# Patient Record
Sex: Female | Born: 1985 | Race: Black or African American | Hispanic: No | Marital: Married | Smoking: Never smoker
Health system: Southern US, Community
[De-identification: ages and names within clinical notes are randomized; demographics above are authoritative.]

## PROBLEM LIST (undated history)

## (undated) ENCOUNTER — Inpatient Hospital Stay (HOSPITAL_COMMUNITY): Payer: Self-pay

## (undated) DIAGNOSIS — G4730 Sleep apnea, unspecified: Secondary | ICD-10-CM

## (undated) DIAGNOSIS — O24419 Gestational diabetes mellitus in pregnancy, unspecified control: Secondary | ICD-10-CM

## (undated) DIAGNOSIS — Z9889 Other specified postprocedural states: Secondary | ICD-10-CM

## (undated) DIAGNOSIS — D649 Anemia, unspecified: Secondary | ICD-10-CM

## (undated) DIAGNOSIS — D219 Benign neoplasm of connective and other soft tissue, unspecified: Secondary | ICD-10-CM

## (undated) DIAGNOSIS — IMO0002 Reserved for concepts with insufficient information to code with codable children: Secondary | ICD-10-CM

## (undated) DIAGNOSIS — O139 Gestational [pregnancy-induced] hypertension without significant proteinuria, unspecified trimester: Secondary | ICD-10-CM

## (undated) DIAGNOSIS — O09299 Supervision of pregnancy with other poor reproductive or obstetric history, unspecified trimester: Secondary | ICD-10-CM

## (undated) DIAGNOSIS — R87629 Unspecified abnormal cytological findings in specimens from vagina: Secondary | ICD-10-CM

## (undated) HISTORY — DX: Unspecified abnormal cytological findings in specimens from vagina: R87.629

## (undated) HISTORY — PX: OTHER SURGICAL HISTORY: SHX169

---
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
## 2008-11-15 DIAGNOSIS — Z9889 Other specified postprocedural states: Secondary | ICD-10-CM

## 2008-11-15 HISTORY — DX: Other specified postprocedural states: Z98.890

## 2009-11-15 DIAGNOSIS — IMO0002 Reserved for concepts with insufficient information to code with codable children: Secondary | ICD-10-CM

## 2009-11-15 DIAGNOSIS — R87619 Unspecified abnormal cytological findings in specimens from cervix uteri: Secondary | ICD-10-CM

## 2009-11-15 HISTORY — DX: Unspecified abnormal cytological findings in specimens from cervix uteri: R87.619

## 2009-11-15 HISTORY — DX: Reserved for concepts with insufficient information to code with codable children: IMO0002

## 2012-10-09 LAB — OB RESULTS CONSOLE HEPATITIS B SURFACE ANTIGEN: Hepatitis B Surface Ag: NEGATIVE

## 2012-10-09 LAB — OB RESULTS CONSOLE HIV ANTIBODY (ROUTINE TESTING): HIV: NONREACTIVE

## 2012-10-09 LAB — OB RESULTS CONSOLE RUBELLA ANTIBODY, IGM: Rubella: IMMUNE

## 2012-10-30 ENCOUNTER — Other Ambulatory Visit (HOSPITAL_COMMUNITY)
Admission: RE | Admit: 2012-10-30 | Discharge: 2012-10-30 | Disposition: A | Discharge status: Home / Self Care | Payer: BC Managed Care – PPO | Source: Ambulatory Visit | Attending: Obstetrics and Gynecology | Admitting: Obstetrics and Gynecology

## 2012-10-30 DIAGNOSIS — Z113 Encounter for screening for infections with a predominantly sexual mode of transmission: Secondary | ICD-10-CM | POA: Insufficient documentation

## 2012-10-30 DIAGNOSIS — Z01419 Encounter for gynecological examination (general) (routine) without abnormal findings: Secondary | ICD-10-CM | POA: Insufficient documentation

## 2012-11-10 ENCOUNTER — Other Ambulatory Visit: Payer: Self-pay

## 2012-11-15 ENCOUNTER — Inpatient Hospital Stay (HOSPITAL_COMMUNITY)
Admission: AD | Admit: 2012-11-15 | Discharge: 2012-11-15 | Disposition: A | Discharge status: Home / Self Care | Payer: BC Managed Care – PPO | Source: Ambulatory Visit | Attending: Obstetrics and Gynecology | Admitting: Obstetrics and Gynecology

## 2012-11-15 ENCOUNTER — Encounter (HOSPITAL_COMMUNITY): Payer: Self-pay

## 2012-11-15 ENCOUNTER — Inpatient Hospital Stay (HOSPITAL_COMMUNITY): Payer: BC Managed Care – PPO

## 2012-11-15 DIAGNOSIS — O26859 Spotting complicating pregnancy, unspecified trimester: Secondary | ICD-10-CM | POA: Insufficient documentation

## 2012-11-15 DIAGNOSIS — O4690 Antepartum hemorrhage, unspecified, unspecified trimester: Secondary | ICD-10-CM

## 2012-11-15 NOTE — MAU Provider Note (Signed)
  History     CSN: [id number]  Arrival date and time: 11/15/12 1103   None     Chief Complaint  Patient presents with  . Vaginal Bleeding   HPI  Pt is G1P0 with spotting in pregnancy - she initially had spotting in pregnancy PC on 12/15 and the spotting stopped and then had another ultrasound done on 12/19 with confirmation of IUP.  This morning had some spotting notices mostly when goes to the bathroom.  Pt denies cramping or pain or UTI symptoms. Pt says she was treated for an STI on 12/19 and will have f/u on return OB visit.  No past medical history on file.  Past Surgical History  Procedure Date  . Cyst on foot     No family history on file.  History  Substance Use Topics  . Smoking status: Never Smoker   . Smokeless tobacco: Not on file  . Alcohol Use: No    Allergies:  Allergies  Allergen Reactions  . Penicillins Other (See Comments)    Sleeps a lot more than usual  . Shellfish Allergy Hives    Prescriptions prior to admission  Medication Sig Dispense Refill  . Fe Fum-FePoly-Vit C-Vit B3 (INTEGRA PO) Take 1 capsule by mouth daily.      Marland Kitchen guaiFENesin (MUCINEX) 600 MG 12 hr tablet Take 600 mg by mouth every 12 (twelve) hours.      . ondansetron (ZOFRAN) 4 MG tablet Take 4 mg by mouth every 8 (eight) hours as needed. For nausea      . Prenatal Vit-Fe Fumarate-FA (PRENATAL MULTIVITAMIN) TABS Take 1 tablet by mouth daily.        Review of Systems  Constitutional: Negative for fever and chills.  Gastrointestinal: Negative for nausea, vomiting, abdominal pain, diarrhea and constipation.  Genitourinary: Negative for dysuria and urgency.  Neurological: Negative for headaches.   Physical Exam   Blood pressure 133/78, pulse 84, temperature 98.4 F (36.9 C), temperature source Oral, resp. rate 16, height 5\' 5"  (1.651 m), weight 291 lb 6 oz (132.167 kg), last menstrual period 08/16/2012.  Physical Exam  Nursing note and vitals reviewed. Constitutional: She is  oriented to person, place, and time. She appears well-developed and well-nourished.  HENT:  Head: Normocephalic.  Eyes: Pupils are equal, round, and reactive to light.  Neck: Normal range of motion. Neck supple.  Cardiovascular: Normal rate.   Respiratory: Effort normal.  GI: Soft. Bowel sounds are normal.  Genitourinary:       Small amount of bright blood in vault; cervix closed; uterus unable to palpable - FHT unable to doppler due to habitus  Musculoskeletal: Normal range of motion.  Neurological: She is alert and oriented to person, place, and time.  Skin: Skin is warm and dry.  Psychiatric: She has a normal mood and affect.  unable to doppler FHT, so ultrasound performed  MAU Course  Procedures   No results found for this or any previous visit (from the past 24 hour(s)). Ultrasound showed single living IUP [redacted] weeks GA; no evidence of SCH - placenta above cervical os Attempted to call Dr. Idamae Schuller- LM on VM Pt discharged home- to return if increase in bleeding or pain Assessment and Plan  Bleeding in pregnancy- f/u with Dr. Idamae Schuller- return sooner if increase in bleeding LINEBERRY,SUSAN 11/15/2012, 12:19 PM

## 2012-11-15 NOTE — MAU Note (Signed)
Unable to obtain fht by doppler

## 2012-11-15 NOTE — MAU Note (Signed)
Pt states began bleeding at 0930 this am, passed 2 small clots, ?eraserhead sized, denies pain at present. Had u/s 11/02/2012, routine.

## 2013-01-04 ENCOUNTER — Other Ambulatory Visit: Payer: Self-pay | Admitting: Obstetrics and Gynecology

## 2013-01-04 DIAGNOSIS — IMO0002 Reserved for concepts with insufficient information to code with codable children: Secondary | ICD-10-CM

## 2013-01-11 ENCOUNTER — Ambulatory Visit (HOSPITAL_COMMUNITY)
Admission: RE | Admit: 2013-01-11 | Discharge: 2013-01-11 | Disposition: A | Discharge status: Home / Self Care | Payer: BC Managed Care – PPO | Source: Ambulatory Visit | Attending: Obstetrics and Gynecology | Admitting: Obstetrics and Gynecology

## 2013-01-11 DIAGNOSIS — O99210 Obesity complicating pregnancy, unspecified trimester: Secondary | ICD-10-CM | POA: Insufficient documentation

## 2013-01-11 DIAGNOSIS — E669 Obesity, unspecified: Secondary | ICD-10-CM | POA: Insufficient documentation

## 2013-01-11 DIAGNOSIS — IMO0002 Reserved for concepts with insufficient information to code with codable children: Secondary | ICD-10-CM

## 2013-01-11 DIAGNOSIS — Z363 Encounter for antenatal screening for malformations: Secondary | ICD-10-CM | POA: Insufficient documentation

## 2013-01-11 DIAGNOSIS — Z1389 Encounter for screening for other disorder: Secondary | ICD-10-CM | POA: Insufficient documentation

## 2013-01-11 DIAGNOSIS — O358XX0 Maternal care for other (suspected) fetal abnormality and damage, not applicable or unspecified: Secondary | ICD-10-CM | POA: Insufficient documentation

## 2013-01-11 DIAGNOSIS — O99810 Abnormal glucose complicating pregnancy: Secondary | ICD-10-CM | POA: Insufficient documentation

## 2013-02-06 ENCOUNTER — Other Ambulatory Visit: Payer: Self-pay | Admitting: Obstetrics and Gynecology

## 2013-02-06 DIAGNOSIS — O24419 Gestational diabetes mellitus in pregnancy, unspecified control: Secondary | ICD-10-CM

## 2013-02-08 ENCOUNTER — Ambulatory Visit (HOSPITAL_COMMUNITY)
Admission: RE | Admit: 2013-02-08 | Discharge: 2013-02-08 | Disposition: A | Discharge status: Home / Self Care | Payer: BC Managed Care – PPO | Source: Ambulatory Visit

## 2013-02-08 ENCOUNTER — Other Ambulatory Visit: Payer: Self-pay

## 2013-02-08 ENCOUNTER — Ambulatory Visit (HOSPITAL_COMMUNITY)
Admission: RE | Admit: 2013-02-08 | Discharge: 2013-02-08 | Disposition: A | Discharge status: Home / Self Care | Payer: BC Managed Care – PPO | Source: Ambulatory Visit | Attending: Obstetrics and Gynecology | Admitting: Obstetrics and Gynecology

## 2013-02-08 VITALS — BP 140/85 | HR 82 | Wt 296.5 lb

## 2013-02-08 DIAGNOSIS — O99810 Abnormal glucose complicating pregnancy: Secondary | ICD-10-CM | POA: Insufficient documentation

## 2013-02-08 DIAGNOSIS — O358XX0 Maternal care for other (suspected) fetal abnormality and damage, not applicable or unspecified: Secondary | ICD-10-CM | POA: Insufficient documentation

## 2013-02-08 DIAGNOSIS — O99210 Obesity complicating pregnancy, unspecified trimester: Secondary | ICD-10-CM | POA: Insufficient documentation

## 2013-02-08 DIAGNOSIS — O24419 Gestational diabetes mellitus in pregnancy, unspecified control: Secondary | ICD-10-CM

## 2013-02-08 DIAGNOSIS — E669 Obesity, unspecified: Secondary | ICD-10-CM | POA: Insufficient documentation

## 2013-02-08 NOTE — Progress Notes (Signed)
[name]  was seen today for an ultrasound appointment.  See full report in [facility].  Comments: Ms. Adah Salvage returns today for follow up.  On previous ultrasound, inferior hypoplasia of the cerebellar vermis was noted.  She previously declined amniocentesis and NIPT.  On ultrasound today, this finding is again confirmed.  The remainder of the cranial anatomy appears normal.  A large AV canal is appreciated.  Somewhat limited views of the outflow tracts were obtained due to fetal position and maternal body habitus.  The findings and limitations of the study were discussed with the patient.  The cerebellar abnormalities in addition to the AV canal defect are suspicious for aneuploidy (specifically Trisomy 21) or a genetic syndrome.  See separate note from [employer], broadcasting/film/video.  Impression: Single IUP at 23 5/7 weeks Inferior hypoplasia of the cerebellar vermis - remainder of the cranial anatomy appears normal Large AV canal defect The estimated fetal weight today is at the 22nd %tile; however, the [facility] measures < 3rd%tile (asymmetric growth) Normal amniotic fluid volume  Recommendations: Fetal echo with Peds cardiology (scheduled) Follow up ultrasound for interval growth/ anatomy in 3 weeks Consider fetal MRI in the 3rd trimester to better evaluate the fetal cranium  Alpha Gula, MD

## 2013-02-09 ENCOUNTER — Other Ambulatory Visit: Payer: Self-pay

## 2013-02-09 NOTE — Progress Notes (Addendum)
Genetic Counseling  High-Risk Gestation Note  Appointment Date:  02/08/2013 Referred By: Geryl Rankins, MD Date of Birth:  02-16-1986    Pregnancy History: G1P0 Estimated Date of Delivery: 05/23/13 Estimated Gestational Age: [redacted]w[redacted]d Attending: Alpha Gula, MD   I met with Ms. Robyne Lassiter and her partner, Mr. Emaly Boschert, for genetic counseling because of abnormal ultrasound findings.  We began by reviewing the ultrasound in detail. The patient was previously seen for ultrasound on 01/11/13. Ultrasound at that time visualized hypoplasia of inferior cerebellar vermis, and fetal heart views were not visualized at that time. Follow-up ultrasound today visualized atrioventricular septal defect (AV canal defect) as well as hypoplasia of the inferior cerebellar vermis. The abdominal circumference measures < 3rd%tile (asymmetric growth). A follow-up ultrasound was scheduled in 3 weeks to assess fetal growth and anatomy.   Congenital heart defects occur in approximately 1% of pregnancies. Congenital heart defects may occur due to multifactorial influences, chromosomal abnormalities, genetic syndromes or environmental exposures.  Atrioventricular septal defect (also referred to as AV canal defect or endocardial cushion defect) describes a central defect of the heart involving the atrial septum, ventricular septum, and atrioventricular valves. Fetal echocardiogram was offered for further evaluation of fetal heart, which the couple accepted. Fetal echocardiogram appointment was planned for 02/09/13 with Dr. Bobbye Morton at [facility] office in [place].   The inferior vermis is part of the cerebellum in the brain. Timing of the formation of the cerebellar vermis in gestation is variable, and the vermis may be incomplete in some cases prior to 18 weeks. When hypoplasia or agenesis of the inferior vermis is present in a pregnancy, outcome has been observed to be extremely  variable. Hypoplasia/agenesis of the inferior vermis typically occurs sporadically, but may be associated with chromosome conditions or other genetic syndromes, such as Joubert syndrome. When hypoplasia of the inferior vermis is present, an increased risk for associated congenital anomalies has also been reported. Long-term prognosis depends on the underlying cause and presence of additional features. Isolated hypoplasia of the inferior vermis has been associated with overall good prognosis but has also been associated with an increased risk for developmental delays.  We discussed the variable outcomes associated with hypoplasia of the inferior vermis and that in the absence of a determined underlying etiology, long-term prognosis cannot be determined.   We discussed that an AV canal defect is associated with an increased risk for fetal aneuploidy of approximately 50-60%. Specifically, we discussed that AV canal defect is associated with a significant increased risk for Trisomy 21, which is present in approximately 40% of cases of AV canal defect. In general, the presence of multiple ultrasound findings increases the likelihood for a single underlying etiology. We reviewed chromosomes, genes, nondisjunction and examples and prognoses of various chromosome conditions including Down syndrome (trisomy 66), trisomy 31, and trisomy 42.  Additionally, congenital heart defects and cerebellar vermis hypoplasia can be seen as part of an underlying microdeletion/microduplication syndrome or single gene condition, such as 3C syndrome (Ritscher-Schinzel syndrome).  Congenital heart defects can also be due to teratogenic exposures in pregnancy. Ms. Veleta Yamamoto denied exposure to environmental toxins or chemical agents. She denied the use of alcohol, tobacco or street drugs. She denied significant viral illnesses during the course of her pregnancy. We reviewed that the ultrasound findings may also be isolated and unrelated  to each other and due to multifactorial etiology. We reviewed that the overall prognosis is dependent on the severity of the heart defect and  the underlying etiology.  Risk for recurrence depends on etiology. We also discussed the fact that we may not know the exact etiology until after the baby is born or maybe not until the baby grows and develops.   We reviewed available screening and diagnostic options.  Regarding screening tests, we discussed the options of noninvasive prenatal testing (NIPT). Specifically, we discussed that NIPT analyzes cell free fetal DNA found in the maternal circulation. This test is not diagnostic for chromosome conditions, but can provide information regarding the presence or absence of extra fetal DNA for chromosomes 13, 18 and 21, as well as extra or missing material from chromosomes X and Y. Thus, it would not identify or rule out all fetal aneuploidy and does not screen for all genetic conditions. The reported detection rate is greater than 99% for Trisomy 21, greater than 97% for Trisomy 18, and is approximately 80% (8 out of 10) for Trisomy 13. The false positive rate is reported to be less than 0.1% for any of these conditions.   This couple was also counseled regarding the option of chromosome analysis via amniocentesis. We reviewed the approximate 1 in 300-500 risk for complications, including spontaneous pregnancy loss. We reviewed that testing for single gene conditions is typically not performed via amniocentesis, unless ultrasound findings or family history are strongly suggestive of a particular syndrome. After consideration of all the options, they elected to proceed with cell free fetal DNA testing and declined amniocentesis. They understand that screening cannot diagnose or rule out all birth defects or genetic conditions. The couple stated that they are not interested in amniocentesis given the associated risk of complications. Fetal echocardiogram was planned for  02/09/13 and follow-up ultrasound scheduled for 03/02/13. Additionally, fetal MRI is available later in pregnancy to further view cranial anatomy.   Both family histories were reviewed and found to be contributory for cleft lip for the father of the pregnancy's paternal first cousin. This relative reportedly had surgical correction after birth and is otherwise healthy.  We discussed that cleft lip +/- cleft palate can be syndromic or isolated.  If the patient's relative has a syndromic form of clefting, the chance of having an affected child depends on the inheritance pattern of that condition.  If the patient's relative has an isolated form of clefting, we discussed the probable multifactorial inheritance and explained that genetic testing for isolated cleft lip +/- cleft palate is not currently available.  Based on the family history, this couple's chance to have a baby with an isolated cleft lip +/- cleft palate is not expected to be increased above the general population risk.   Additionally, Mr. Boer reported that his paternal first cousin (the brother to the previously discussed cousin) has sickle cell anemia. The father of the pregnancy's brother reportedly has sickle cell trait. The father of the pregnancy and the patient both reported that they do not have sickle cell trait. Medical documentation confirming this report was not available at the time of today's visit. We discussed sickle cell anemia (SCA) including: the features of SCA, autosomal recessive inheritance, the approximate 1 in 34 carrier frequency in the [place] American population, and the availability of carrier testing.  We also discussed that testing for SCA can be done prenatally via amniocentesis, if desired, when both parents are carriers of sickle cell trait, and it is also routinely screened for on the newborn screening panel.  We discussed that if both Ms. Lassiter and her partner do not have sickle cell  trait, then the pregnancy  would not be at increased risk for sickle cell disease. If they have not been screened, however, then the risk for sickle cell disease in the pregnancy would be approximately 1 in 61 (assuming general population carrier frequency for Ms. Lassiter and a 1 in 2 chance for Mr. Crosby to have sickle cell trait, given the reported family history). If not previously performed, screening via hemoglobin electrophoresis is available to the patient. Without further information regarding the provided family history, an accurate genetic risk cannot be calculated. Further genetic counseling is warranted if more information is obtained.   I counseled this couple regarding the above risks and available options.  The approximate face-to-face time with the genetic counselor was 30 minutes.  Quinn Plowman, MS Certified Genetic Counselor 02/09/2013

## 2013-02-19 ENCOUNTER — Telehealth (HOSPITAL_COMMUNITY): Payer: Self-pay | Admitting: MS"

## 2013-02-19 NOTE — Telephone Encounter (Signed)
Called Fusaye Lassiter to discuss her Harmony, cell free fetal DNA testing. Patient was identified by name and DOB. Testing was offered because of ultrasound finding of AV canal defect. We reviewed that these are positive for Down syndrome, indicating a >99% chance for Down syndrome in the current pregnancy. We reviewed that this testing identified >99% of pregnancies with Down syndrome and has a false positive rate of <0.1%. Reviewed that follow-up testing is recommended to confirm this result and that this would be available either through amniocentesis in pregnancy or postnatal chromosome analysis. Ms. Adah Salvage indicated that she would likely prefer postnatal chromosome analysis.   Ms. Adah Salvage asked if this testing indicated the severity of Down syndrome in the pregnancy. We reviewed that cell free fetal DNA, as well as karyotype analysis via amniocentesis or postnatally cannot correlate severity or specific features of Down syndrome to the specific karyotype, given that all individuals with Down syndrome have a third copy of chromosome 21 but varying features.   Offered follow-up genetic counseling to the couple to further discuss these results as well as discuss available resources. Ms. Adah Salvage is interested in additional resources. She has follow-up ultrasound planned in our office on 03/02/13 at 8:00. I offered to follow-up with the patient on that date, or earlier, if she preferred. She planned for follow-up genetic counseling at 9:00 following her ultrasound on 03/02/13.   Additionally, this testing was within normal limits for trisomies 18 and 13, showing a less than 1 in 10,000 risk for each. This testing also identifies >98% of pregnancies with trisomy 18 and >80% with trisomy 13; the false positive rate is <0.1% for all conditions. Testing was also performed for X and Y chromosome analysis. This did not show evidence of aneuploidy for X or Y. It was also consistent with female. X and Y analysis  has a detection rate of approximately 99%. She understands that this testing does not identify all genetic conditions. All questions were answered to her satisfaction, she was encouraged to call with additional questions or concerns.  Quinn Plowman, MS Certified Genetic Counselor 02/19/2013 4:24 PM

## 2013-02-19 NOTE — Telephone Encounter (Signed)
Left message for patient regarding results. Asked her to return call.   Clydie Braun Nickalos Petersen 02/19/2013 3:16 PM

## 2013-03-02 ENCOUNTER — Ambulatory Visit (HOSPITAL_COMMUNITY)
Admission: RE | Admit: 2013-03-02 | Discharge: 2013-03-02 | Disposition: A | Discharge status: Home / Self Care | Payer: BC Managed Care – PPO | Source: Ambulatory Visit | Attending: Obstetrics and Gynecology | Admitting: Obstetrics and Gynecology

## 2013-03-02 ENCOUNTER — Other Ambulatory Visit (HOSPITAL_COMMUNITY): Payer: Self-pay | Admitting: Maternal and Fetal Medicine

## 2013-03-02 VITALS — BP 128/88 | HR 92 | Wt 301.5 lb

## 2013-03-02 DIAGNOSIS — O24419 Gestational diabetes mellitus in pregnancy, unspecified control: Secondary | ICD-10-CM

## 2013-03-02 DIAGNOSIS — O358XX0 Maternal care for other (suspected) fetal abnormality and damage, not applicable or unspecified: Secondary | ICD-10-CM

## 2013-03-02 DIAGNOSIS — O351XX1 Maternal care for (suspected) chromosomal abnormality in fetus, fetus 1: Secondary | ICD-10-CM

## 2013-03-02 DIAGNOSIS — IMO0002 Reserved for concepts with insufficient information to code with codable children: Secondary | ICD-10-CM

## 2013-03-02 DIAGNOSIS — O99810 Abnormal glucose complicating pregnancy: Secondary | ICD-10-CM | POA: Insufficient documentation

## 2013-03-02 DIAGNOSIS — O289 Unspecified abnormal findings on antenatal screening of mother: Secondary | ICD-10-CM | POA: Insufficient documentation

## 2013-03-02 DIAGNOSIS — E669 Obesity, unspecified: Secondary | ICD-10-CM | POA: Insufficient documentation

## 2013-03-02 DIAGNOSIS — O99210 Obesity complicating pregnancy, unspecified trimester: Secondary | ICD-10-CM | POA: Insufficient documentation

## 2013-03-02 NOTE — Progress Notes (Addendum)
Genetic Counseling  High-Risk Gestation Note  Appointment Date:  03/02/2013 Referred By: Geryl Rankins, MD Date of Birth:  1986/07/20    Pregnancy History: G1P0 Estimated Date of Delivery: 05/23/13 Estimated Gestational Age: [redacted]w[redacted]d Attending: Particia Nearing, MD   I met with [name] and her partner, Mr. Merilee Wible for genetic counseling because of an increased risk for fetal Down syndrome based on noninvasive prenatal screening (cell free fetal DNA) test results.   The results of the noninvasive prenatal screening (NIPS) revealed a greater than 99% chance of fetal Down syndrome. We discussed that this testing identifies >99% of pregnancies with Down syndrome with a false positive rate of 0.1%. We reviewed that this testing is highly sensitive and specific, but is not considered diagnostic. They were counseled that given the ultrasound findings and the positive cell free fetal DNA test result, the suspected diagnosis of Down syndrome is likely accurate. We did review that the cell free fetal DNA test can not distinguish between aneuploidy confined to the placenta, trisomy 21, translocation 21, or mosaic trisomy 21. We discussed the availability of amniocentesis or peripheral blood chromosome analysis for confirmation of the diagnosis. This couple declined the option of amniocentesis. They wish to pursue postnatal confirmatory testing.   Considering the very high suspicion of Down syndrome, they were counseled in detail regarding this diagnosis. We discussed that there are different types of Down syndrome, and each type is determined by the arrangement of the #21 chromosomes. Approximately 95% of cases of Down syndrome are trisomy 21 and 2-4% are due to a translocation involving chromosome 21. We discussed that Down syndrome caused by a translocation can be sporadic or inherited from a parent who carries a balanced translocation. We reviewed chromosomes, nondisjunction, and that chromosome  division errors happen by chance and are not usually inherited. They understand that confirmatory testing will provide an actual karyotype and will help to determine accurate risks of recurrence for a future pregnancy.   This couple was counseled that Down syndrome occurs once per every 800 births and is associated with specific features. However, there is a high degree of variability seen among children who have this condition, meaning that every child with Down syndrome will not be affected in exactly the same way, and some children will have more or less features than others. They were counseled that approximately half of individuals with Down syndrome have a cardiac anomaly and ~10-15% have an intestinal difference. Other anomalies of various organ systems have also been described in association with this diagnosis. Ms. Adah Salvage has follow-up fetal echocardiogram scheduled with Dr. Bobbye Morton, [facility] Cardiologist, on 03/16/13.  We discussed that in general most individuals with Down syndrome have mild to moderate intellectual disability and likely will require extra assistance with school work. Approximately 70-80% of children with Down syndrome have hypotonia which may lead to delays in sitting, walking, and talking. Hypotonia does tend to improve with age and early intervention services such as physical, occupational, and speech therapies can help with achievement of developmental milestones. We discussed that these therapies are typically provided to children (with qualifying diagnoses) from birth until age 109. We also discussed that Down syndrome is associated with characteristic facial features. Because of these facial features, many children with Down syndrome look similar to each other, but they were reminded that each child with Down syndrome is unique and will have many more features in common with his or her own family members.   We also reviewed  that the AAP have  established health supervision guidelines for individuals with Down syndrome. These guidelines provide medical management recommendations for various stages of life and can be a resource for families who have a child with Down syndrome as well as for health professionals. We also discussed that providing children with Down syndrome with a stimulating physical and social environment, as well as ensuring that the child receives adequate medical care and appropriate therapies, will help these children to reach their full potential. With the advances in medical technology, early intervention, and supportive therapies, many individuals with Down syndrome are able to live with an increasing degree of independence. Today, many adults with Down syndrome care for themselves, have jobs, and often times live in group homes or apartments where assistance is available if needed.   We discussed local and national support organizations. Additionally, we discussed that postnatal health management can be coordinated by a medical geneticist as well as with a multidisciplinary team of physicians (Down syndrome clinic). This couple was encouraged to contact us if they have any additional questions/concerns or if they need additional supportive or informational recommendations.   Follow-up ultrasound was performed at the time of today's visit. Complete ultrasound results reported separately. Follow-up ultrasound was planned in 1 week.   The majority of time (>50%) was spent on counseling with this couple. The approximate face-to-face time with the genetic counselor was 30 minutes.   Quinn Plowman, MS Certified Genetic Counselor 03/02/2013

## 2013-03-05 ENCOUNTER — Other Ambulatory Visit (HOSPITAL_COMMUNITY): Payer: Self-pay | Admitting: Maternal and Fetal Medicine

## 2013-03-05 DIAGNOSIS — O24419 Gestational diabetes mellitus in pregnancy, unspecified control: Secondary | ICD-10-CM

## 2013-03-05 DIAGNOSIS — O358XX0 Maternal care for other (suspected) fetal abnormality and damage, not applicable or unspecified: Secondary | ICD-10-CM

## 2013-03-08 ENCOUNTER — Inpatient Hospital Stay (HOSPITAL_COMMUNITY)
Admission: AD | Admit: 2013-03-08 | Discharge: 2013-04-06 | DRG: 370 | Disposition: A | Discharge status: Home / Self Care | Payer: BC Managed Care – PPO | Source: Ambulatory Visit | Attending: Obstetrics and Gynecology | Admitting: Obstetrics and Gynecology

## 2013-03-08 ENCOUNTER — Encounter (HOSPITAL_COMMUNITY): Payer: Self-pay | Admitting: *Deleted

## 2013-03-08 ENCOUNTER — Ambulatory Visit (HOSPITAL_COMMUNITY)
Admission: RE | Admit: 2013-03-08 | Discharge: 2013-03-08 | Disposition: A | Discharge status: Home / Self Care | Payer: BC Managed Care – PPO | Source: Ambulatory Visit | Attending: Obstetrics and Gynecology | Admitting: Obstetrics and Gynecology

## 2013-03-08 DIAGNOSIS — O99810 Abnormal glucose complicating pregnancy: Secondary | ICD-10-CM | POA: Insufficient documentation

## 2013-03-08 DIAGNOSIS — O9902 Anemia complicating childbirth: Secondary | ICD-10-CM | POA: Diagnosis present

## 2013-03-08 DIAGNOSIS — D6489 Other specified anemias: Secondary | ICD-10-CM | POA: Diagnosis present

## 2013-03-08 DIAGNOSIS — O26879 Cervical shortening, unspecified trimester: Secondary | ICD-10-CM | POA: Diagnosis present

## 2013-03-08 DIAGNOSIS — O99814 Abnormal glucose complicating childbirth: Secondary | ICD-10-CM | POA: Diagnosis present

## 2013-03-08 DIAGNOSIS — E669 Obesity, unspecified: Secondary | ICD-10-CM | POA: Diagnosis present

## 2013-03-08 DIAGNOSIS — O351XX0 Maternal care for (suspected) chromosomal abnormality in fetus, not applicable or unspecified: Secondary | ICD-10-CM | POA: Diagnosis present

## 2013-03-08 DIAGNOSIS — Q212 Atrioventricular septal defect: Secondary | ICD-10-CM

## 2013-03-08 DIAGNOSIS — O411090 Infection of amniotic sac and membranes, unspecified, unspecified trimester, not applicable or unspecified: Secondary | ICD-10-CM | POA: Diagnosis present

## 2013-03-08 DIAGNOSIS — O358XX0 Maternal care for other (suspected) fetal abnormality and damage, not applicable or unspecified: Secondary | ICD-10-CM

## 2013-03-08 DIAGNOSIS — Z98891 History of uterine scar from previous surgery: Secondary | ICD-10-CM

## 2013-03-08 DIAGNOSIS — O3430 Maternal care for cervical incompetence, unspecified trimester: Secondary | ICD-10-CM | POA: Diagnosis present

## 2013-03-08 DIAGNOSIS — O24419 Gestational diabetes mellitus in pregnancy, unspecified control: Secondary | ICD-10-CM

## 2013-03-08 DIAGNOSIS — O365990 Maternal care for other known or suspected poor fetal growth, unspecified trimester, not applicable or unspecified: Secondary | ICD-10-CM | POA: Diagnosis present

## 2013-03-08 DIAGNOSIS — O99214 Obesity complicating childbirth: Secondary | ICD-10-CM | POA: Diagnosis present

## 2013-03-08 DIAGNOSIS — Z794 Long term (current) use of insulin: Secondary | ICD-10-CM

## 2013-03-08 DIAGNOSIS — O3510X0 Maternal care for (suspected) chromosomal abnormality in fetus, unspecified, not applicable or unspecified: Secondary | ICD-10-CM | POA: Diagnosis present

## 2013-03-08 DIAGNOSIS — IMO0002 Reserved for concepts with insufficient information to code with codable children: Secondary | ICD-10-CM

## 2013-03-08 DIAGNOSIS — O321XX0 Maternal care for breech presentation, not applicable or unspecified: Secondary | ICD-10-CM | POA: Diagnosis present

## 2013-03-08 DIAGNOSIS — O289 Unspecified abnormal findings on antenatal screening of mother: Secondary | ICD-10-CM | POA: Insufficient documentation

## 2013-03-08 HISTORY — DX: Benign neoplasm of connective and other soft tissue, unspecified: D21.9

## 2013-03-08 HISTORY — DX: Anemia, unspecified: D64.9

## 2013-03-08 HISTORY — DX: Reserved for concepts with insufficient information to code with codable children: IMO0002

## 2013-03-08 HISTORY — DX: Gestational diabetes mellitus in pregnancy, unspecified control: O24.419

## 2013-03-08 HISTORY — DX: Sleep apnea, unspecified: G47.30

## 2013-03-08 LAB — COMPREHENSIVE METABOLIC PANEL
ALT: 20 U/L (ref 0–35)
Alkaline Phosphatase: 91 U/L (ref 39–117)
BUN: 7 mg/dL (ref 6–23)
CO2: 23 mEq/L (ref 19–32)
Chloride: 103 mEq/L (ref 96–112)
GFR calc Af Amer: >90 mL/min (ref >90)
Glucose, Bld: 159 mg/dL — ABNORMAL HIGH (ref 70–99)
Potassium: 3.9 mEq/L (ref 3.5–5.1)
Sodium: 135 mEq/L (ref 135–145)
Total Bilirubin: 0.1 mg/dL — ABNORMAL LOW (ref 0.3–1.2)
Total Protein: 5.4 g/dL — ABNORMAL LOW (ref 6.0–8.3)

## 2013-03-08 LAB — HEMOGLOBIN A1C
Hgb A1c MFr Bld: 5.8 % — ABNORMAL HIGH (ref <5.7)
Mean Plasma Glucose: 120 mg/dL — ABNORMAL HIGH (ref <117)

## 2013-03-08 LAB — GLUCOSE, CAPILLARY
Glucose-Capillary: 104 mg/dL — ABNORMAL HIGH (ref 70–99)
Glucose-Capillary: 112 mg/dL — ABNORMAL HIGH (ref 70–99)
Glucose-Capillary: 144 mg/dL — ABNORMAL HIGH (ref 70–99)

## 2013-03-08 LAB — CBC
HCT: 32 % — ABNORMAL LOW (ref 36.0–46.0)
Hemoglobin: 10.4 g/dL — ABNORMAL LOW (ref 12.0–15.0)
MCHC: 32.5 g/dL (ref 30.0–36.0)
RBC: 4.12 MIL/uL (ref 3.87–5.11)
WBC: 8 10*3/uL (ref 4.0–10.5)

## 2013-03-08 MED ORDER — ACETAMINOPHEN 325 MG PO TABS
650.0000 mg | ORAL_TABLET | ORAL | Status: DC | PRN
  Administered 2013-03-10: 650 mg via ORAL
  Filled 2013-03-08: qty 2

## 2013-03-08 MED ORDER — CALCIUM CARBONATE ANTACID 500 MG PO CHEW
2.0000 | CHEWABLE_TABLET | ORAL | Status: DC | PRN
  Administered 2013-03-10: 500 mg via ORAL
  Administered 2013-03-14 – 2013-03-24 (×2): 400 mg via ORAL
  Filled 2013-03-08: qty 2
  Filled 2013-03-08 (×3): qty 1

## 2013-03-08 MED ORDER — PRENATAL MULTIVITAMIN CH
1.0000 | ORAL_TABLET | Freq: Every day | ORAL | Status: DC
  Administered 2013-03-08 – 2013-04-03 (×27): 1 via ORAL
  Filled 2013-03-08 (×27): qty 1

## 2013-03-08 MED ORDER — BETAMETHASONE SOD PHOS & ACET 6 (3-3) MG/ML IJ SUSP
12.0000 mg | INTRAMUSCULAR | Status: AC
  Administered 2013-03-08 – 2013-03-09 (×2): 12 mg via INTRAMUSCULAR
  Filled 2013-03-08 (×2): qty 2

## 2013-03-08 MED ORDER — LACTATED RINGERS IV BOLUS (SEPSIS)
1000.0000 mL | Freq: Once | INTRAVENOUS | Status: AC
  Administered 2013-03-08: 1000 mL via INTRAVENOUS

## 2013-03-08 MED ORDER — DOCUSATE SODIUM 100 MG PO CAPS
100.0000 mg | ORAL_CAPSULE | Freq: Every day | ORAL | Status: DC
  Administered 2013-03-08 – 2013-03-17 (×9): 100 mg via ORAL
  Filled 2013-03-08 (×10): qty 1

## 2013-03-08 MED ORDER — FERROUS FUMARATE 325 (106 FE) MG PO TABS
1.0000 | ORAL_TABLET | Freq: Two times a day (BID) | ORAL | Status: DC
  Administered 2013-03-08 – 2013-04-03 (×53): 106 mg via ORAL
  Filled 2013-03-08 (×55): qty 1

## 2013-03-08 NOTE — Consult Note (Addendum)
Asked by Dr. Dion Body to provide prenatal consultation for patient at risk for preterm delivery due to fetal blood flow concerns (absent end-diastolic flow noted at MFM today). Mother is 27 y.o. G1 who is now 29 1/[redacted] weeks EGA, with pregnancy complicated by prenatal US showing AV canal and Harmony testing indicating high likelihood of Trisomy 39.  She was admitted today because of the Doppler flow and is being treated with betamethasone.  Discussed usual expectations for preterm infant at 86 - [redacted] weeks gestation, including possible needs for DR resuscitation, respiratory support, IV access, and blood products.  Projected possible length of stay in NICU until 36 - [redacted] wks EGA.  Discussed advantages of feeding with mother's milk.  She plans to pump postnatally.  Patient was attentive, had appropriate questions, and was appreciative of my input.  Thank you for consulting neonatology.  I spent 25 minutes reviewing records (5 min) and face-to-face with patient (20 min)  Add: offered NICU tour for patient and/or FOB if desired

## 2013-03-08 NOTE — H&P (Signed)
[name] is a 27 y.o. female G1 at 43 1/7 weeks c/w 9 week ultrasound. 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  Pt is morbidly obese.  First trimester glucose screening revealed gestational diabetes at ~12 weeks.   HgA1C at that time was 5.2.  GDM has been diet controlled.  Trich positive x 2.  TOC neg 01/31/13. Pt has been followed by MFM since ~22 weeks for a f/u anatomy scan.  The above findings were noted and appropriate testing followed.  Pt presented today for f/u with MFM for BPP and UA dopplers.  Absent diastolic flow noted on UA dopplers and also asymmetric growth (AC lagging).  BPP was 8/8, AFI=13 cm.  BP was slightly elevated at ~135/98.  Dr. Claudean Severance recommended inpatient surveillance, betamethasone and daily UA dopplers.  Due to slightly elevated BP and risk factors, preeclampsia w/u including 24 hour urine recommended. Pt states she is doing well.  Denies contractions, LOF or VB.  Pt reports fetus is very active.  In fact, pt states she felt fetal movement was decreased last week.  Feels "baby has moved more today than it did all last week."  Denies headaches, visual changes, or abdominal pain. Pt reports she and her husband are handling the likely diagnosis of DS as best they can.  Pt feels they have gotten adequate counseling and support.    Maternal Medical History:  Reason for admission: Nausea.  Fetal activity: Perceived fetal activity is normal.   Last perceived fetal movement was within the past hour.    Prenatal complications: Fetus with Down Syndrome  Prenatal Complications - Diabetes: gestational. Diabetes is managed by diet.      OB History   Grav Para Term Preterm Abortions TAB SAB Ect Mult Living   1              Past Medical History  Diagnosis Date  . Pregnancy induced hypertension   . Abnormal Pap smear 2011  . Sleep apnea 2004  . Anemia   .  Fibroid   . Gestational diabetes    Past Surgical History  Procedure Laterality Date  . Cyst on foot     Family History: family history includes Arthritis in her father and maternal grandmother; Asthma in her brother; COPD in her maternal grandfather; Cancer in her maternal grandfather; Diabetes in her father, maternal grandmother, and maternal uncle; and Hypertension in her brother and mother. Social History:  reports that she has never smoked. She does not have any smokeless tobacco history on file. She reports that she does not drink alcohol or use illicit drugs.   Prenatal Transfer Tool  Maternal Diabetes: Yes:  Diabetes Type:  Diet controlled Genetic Screening: Abnormal:  Results: Trisomy 21 Maternal Ultrasounds/Referrals: Abnormal:  Findings:   Cardiac defect Fetal Ultrasounds or other Referrals:  Fetal echo, Referred to Materal Fetal Medicine  Maternal Substance Abuse:  No Significant Maternal Medications:  None Significant Maternal Lab Results:  Trich x 2.  TOC neg 01/2013 Other Comments:  Morbid obesity  Review of Systems  Constitutional: Negative for fever and chills.  Respiratory: Negative for shortness of breath.   Gastrointestinal: Negative for nausea, vomiting and abdominal pain.  Neurological: Negative for headaches.      Blood pressure 120/62, pulse 101, temperature 98.5 F (36.9 C), temperature source Oral, resp. rate 20, height 5\' 8"  (1.727 m), weight 136.533 kg (301 lb), last menstrual  period 08/16/2012. Maternal Exam:  Abdomen: Estimated fetal weight is 03/02/2013-942 grams, 2 lb. 1 oz, 20th%.   Fetal presentation: breech  Introitus: not evaluated.   Cervix: not evaluated.   Fetal Exam Fetal Monitor Review: Baseline rate: 130s.  Variability: moderate (6-25 bpm).   Pattern: accelerations present.    Fetal State Assessment: Category I - tracings are normal.     Physical Exam  Constitutional: She is oriented to person, place, and time. She appears  well-developed and well-nourished. No distress.  HENT:  Head: Normocephalic and atraumatic.  Eyes: EOM are normal.  Neck: Normal range of motion.  Cardiovascular: Normal rate, regular rhythm and normal heart sounds.   Respiratory: No respiratory distress. She has no wheezes.  GI: Soft. There is no tenderness. There is no rebound.  Musculoskeletal: Normal range of motion. She exhibits edema. She exhibits no tenderness.  TED hose on.  Neurological: She is alert and oriented to person, place, and time.  Reflexes not illicited.  Skin: Skin is warm and dry.  Psychiatric: She has a normal mood and affect.    Labs reviewed.  Normal except Hg 10.4.  Hg A1C 5.8. 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  -Continuous NST x 24 hours.  Difficult to trace due to early GA and morbid obesity.  Await recommendations by MFM on fetal monitoring after tomorrow's UA dopplers. -BMZ for fetal lung maturity. -Appreciate timely NICU consultation. -S/p IVF bolus. Gestational Diabetes, Diet controlled. CBG Fasting, 2 hours postprandial breakfast, lunch and dinner.  22-2400 ADA diet.  S/p nutrition consult.  Anticpate increase in blood glucose s/p BMZ.  Insulin prn. Elevated BP- No s/sxs of preeclampsia.  BPs and labs normal.  24 hour collection complete tomorrow at 0850.  Bedrest with BRPs. Breech presentation. Morbid obesity-TED hose for DVT. Anemia- Iron BID. Discussed care plan at length and all questions were answered.  Proceed with delivery with worsening fetal status, reverse diastolic flow.  Discussed possible cesarean section for fetal intolerance to labor.  Unaware at time of counseling that pt was breech on today's ultrasound. 1 week prior was cephalic.   If breech when delivery indicated, proceed with cesarean section.  Pt informed that Dr. Richardson Dopp would be on call starting tomorrow morning and throughout the weekend.   Appreciate co-management with MFM and phone consultation with Dr. Claudean Severance.   Geryl Rankins 03/08/2013, 7:16 PM

## 2013-03-08 NOTE — Progress Notes (Signed)
Dr. Eric Form in to see pt and talk with her about POC

## 2013-03-08 NOTE — Progress Notes (Signed)
Intermittent FHR tracing, pt changing positions frequently

## 2013-03-08 NOTE — Progress Notes (Signed)
Antenatal Nutrition Assessment:  Currently  29 1/[redacted] weeks gestation, with Absent end diastolic flow   Height  66.5 inches Weight 301 Lbs pre-pregnancy weight 289 Lbs.Pre-pregnancy  BMI 45.9  IBW 133 lbs  Total weight gain 12 lbs. Weight gain goals 11-20 lbs.   Estimated needs: 22-2400 kcal/day, 85-95  grams protein/day, 2.5 liters fluid/day  Carbohydrate modified gestational diet tolerated well, appetite good. Pt followed a meal plan with a higher CHO limit at home, 45 g at breakfast, 60 g at L & D. Will not adjust CHO limit up due to betamethasone that is planned. Will increase protein allowed to compensate. Current diet prescription will provide for increased needs.  No abnormal nutrition related labs. Pt reports that CBG's were wnl at home, < 90 fasting and < 120 2 hrs pp CBG (last 3)   Recent Labs  03/08/13 1300  GLUCAP 104*     Nutrition Dx: Increased nutrient needs r/t pregnancy and fetal growth requirements aeb [redacted] weeks gestation.  No educational needs assessed at this time. Pt received diet education at 10 - [redacted] weeks gestation. A copy of the GDM meal plan was given to pt. Pt understands how to count her CHO's.  [name] M.Odis Luster LDN Neonatal Nutrition Support Specialist Pager [phone]

## 2013-03-08 NOTE — Progress Notes (Signed)
[name]  was seen today for an ultrasound appointment.  See full report in [facility].  Comments: Ms. [name] return for follow up BPP and UA Dopplers.  On previous ultrasound, the fetus was noted to have an AV canal and a hypoplastic inferior cerebellar vermis.  The fetus was screen positive for Trisomy 21 by NIPT. Ms. [name] was cleared for delivery at [facility] by [facility] cardiology.  On her most recent ultrasound, elevated UA Doppler studies were noted.  Fetal growth on last evaluation was overall appropriate (20th %tile) with an AC that measured at the 6th %tile (asymmetric growth).  Impression: IUP at 29 1/7  weeks Asymmetric growth by most recent ultrasound Absent end diastolic flow noted without evidenced of reversed flow.  Normal Ductus Venosus Doppler waveform. The fetus is active with a BPP of 8/8 Normal amniotic fluid volume.  Recommendations: Recommend hospitalization for daily Dopplers / BPPs Betamethasone series 24-hr urine protein, preeclampsia labs  Recommendations discussed with Dr. Dion Body.  Alpha Gula, MD

## 2013-03-08 NOTE — Progress Notes (Signed)
[name] is still processing all that has happened.  She is understandably anxious about having her baby early.  They have very good family support and are supporting each other well. I helped them name some of their fears and emotions and offered emotional support.    They are aware of on-going availability of chaplain support and we will continue to follow up as we are able.  Please also page as needs arise.  [employer] Pager, [phone] 1:14 PM

## 2013-03-09 ENCOUNTER — Inpatient Hospital Stay (HOSPITAL_COMMUNITY): Payer: BC Managed Care – PPO

## 2013-03-09 LAB — CREATININE CLEARANCE, URINE, 24 HOUR
Creatinine, 24H Ur: 1801 mg/d — ABNORMAL HIGH (ref 700–1800)
Creatinine: 0.49 mg/dL — ABNORMAL LOW (ref 0.50–1.10)

## 2013-03-09 LAB — PROTEIN, URINE, 24 HOUR: Urine Total Volume-UPROT: 5025 mL

## 2013-03-09 LAB — GLUCOSE, CAPILLARY
Glucose-Capillary: 128 mg/dL — ABNORMAL HIGH (ref 70–99)
Glucose-Capillary: 94 mg/dL (ref 70–99)

## 2013-03-09 NOTE — Progress Notes (Signed)
This note also relates to the following rows which could not be included: CBG Lab Component - View only - Cannot attach notes to extension rows   TC from Dr. Dion Body reported GBG results and difficulty with fetal monitroring due to obesity and active fetus.  Also reported spontaneous decels.  MD aware and had viewed strip remotely.

## 2013-03-09 NOTE — Progress Notes (Signed)
This was a follow-up visit with [name].  She is in good spirits and is feeling relieved that she had a good report after her ultrasound today.  She is trying to use the time wisely to finish up some school work before the baby arrives.  I offered reflective listening and encouragement.  [address] [place] Pager, [phone] 3:14 PM   03/09/13 1500  Clinical Encounter Type  Visited With Patient  Visit Type Follow-up

## 2013-03-09 NOTE — Progress Notes (Signed)
Pt taken off the monitor after ressurring FHR and orders changed to Q6hr monitoring.

## 2013-03-09 NOTE — Progress Notes (Signed)
[name] is a 27 y.o. G1P0 at [redacted]w[redacted]d  admitted for Absent End Diastolic Dopplers and growth restriction.  Subjective: +FM no lof no vaginal bleeding. No ha/ visual disturbances or ruq pain.   Objective: BP 122/78  Pulse 97  Temp(Src) 98.7 F (37.1 C) (Oral)  Resp 18  Ht 5\' 8"  (1.727 m)  Wt 136.533 kg (301 lb)  BMI 45.78 kg/m2  LMP 08/16/2012 I/O last 3 completed shifts: In: -  Out: 1000 [Urine:1000]    FHT:  FHR: 130 bpm, variability: moderate,  accelerations:  Present,  decelerations:  Absent UC:   none SVE:      Labs: Lab Results  Component Value Date   WBC 8.0 03/08/2013   HGB 10.4* 03/08/2013   HCT 32.0* 03/08/2013   MCV 77.7* 03/08/2013   PLT 285 03/08/2013   Assessment / Plan: 29 wks and 2 days with IUGR  and absent end diastolic flow on 02/26/2439 with improvement in dopplers today and bpp 8/8 Daily dopplers and bpp Change fetal monitoring to nst q 6 hours.  BMZ for fetal lunc maturity   03/09/2013, 11:05 PM

## 2013-03-09 NOTE — Progress Notes (Signed)
[name]  was seen today for an ultrasound appointment.  See full report in [facility].  Comments: Ms. [name] return for follow up BPP and UA Dopplers.  On previous ultrasound, the fetus was noted to have an AV canal and a hypoplastic inferior cerebellar vermis.  The fetus was screen positive for Trisomy 21 by NIPT. Ms. [name] was cleared for delivery at [facility] by [facility] cardiology.  Fetal growth on last evaluation was overall appropriate (20th %tile) with an AC that measured at the 6th %tile (asymmetric growth).  On ultrasound yesterday, AEDF was noted on UA Dopplers.  She is currently admitted for betamethasone, daily Dopplers and BPP.  Impression: IUP at 29 2/7  weeks Asymmetric growth by most recent ultrasound; AEDF noted on ultrasound yesterday. The fetus is active with a BPP of 8/8 Elevated UA Dopplers (> 97th %tile).  Diastolic flow noted on all tracings. Normal Ductus Venosus waveform. Normal amniotic fluid volume.  Recommendations: Continue daily UA Dopplers and BPPs.  Alpha Gula, MD

## 2013-03-09 NOTE — Progress Notes (Signed)
Pt back in the bed to better be able to trace FHR.

## 2013-03-10 ENCOUNTER — Inpatient Hospital Stay (HOSPITAL_COMMUNITY): Payer: BC Managed Care – PPO

## 2013-03-10 LAB — GLUCOSE, CAPILLARY
Glucose-Capillary: 95 mg/dL (ref 70–99)
Glucose-Capillary: 89 mg/dL (ref 70–99)
Glucose-Capillary: 84 mg/dL (ref 70–99)
Glucose-Capillary: 133 mg/dL — ABNORMAL HIGH (ref 70–99)

## 2013-03-10 MED ORDER — LACTATED RINGERS IV BOLUS (SEPSIS)
1000.0000 mL | Freq: Once | INTRAVENOUS | Status: AC
  Administered 2013-03-10: 1000 mL via INTRAVENOUS

## 2013-03-10 NOTE — Progress Notes (Signed)
[name] is a 27 y.o. G1P0 at [redacted]w[redacted]d by ultrasound admitted for IUGR with absent end diastolic flow. Trisomy 21 Fetus.  Subjective:  + FM occasional contractions no leakage of fluid no vaginal bleeding.   Objective: BP 112/71  Pulse 81  Temp(Src) 98.5 F (36.9 C) (Oral)  Resp 18  Ht 5\' 8"  (1.727 m)  Wt 136.533 kg (301 lb)  BMI 45.78 kg/m2  SpO2 100%  LMP 08/16/2012      FHT:  FHR: 130 bpm, variability: minimal ,  accelerations:  Abscent,  decelerations:  Absent UC:   none SVE:   Dilation: Closed Exam by:: dr Richardson Dopp  Labs: Lab Results  Component Value Date   WBC 8.0 03/08/2013   HGB 10.4* 03/08/2013   HCT 32.0* 03/08/2013   MCV 77.7* 03/08/2013   PLT 285 03/08/2013    Assessment / Plan: 29 wk s and 3 days with IUGR/ trisomy 21/ Gestational DM Ultrasound today shows transient absent and reversed flow BPP is 8/8  Fetus is Breech.  Discussed intermittent absent and transient reversal with Dr Sherrie George. Recommendation is continued fetal antenatal testing with daily dopplers and BPP 8/8 Per Dr. Sherrie George delivery is not warranted at this time.    Jahmia Berrett J. 03/10/2013, 6:47 PM

## 2013-03-11 ENCOUNTER — Inpatient Hospital Stay (HOSPITAL_COMMUNITY): Payer: BC Managed Care – PPO

## 2013-03-11 LAB — GLUCOSE, CAPILLARY
Glucose-Capillary: 88 mg/dL (ref 70–99)
Glucose-Capillary: 91 mg/dL (ref 70–99)

## 2013-03-11 LAB — TYPE AND SCREEN: Antibody Screen: NEGATIVE

## 2013-03-11 NOTE — Progress Notes (Signed)
[name] is a 27 y.o. G1P0 at [redacted]w[redacted]d by ultrasound admitted for IUGR with absent end diastolic flow. Trisomy 21 Fetus.  Subjective:  + FM  No contractions no leakage of fluid no vaginal bleeding.  Objective:  Afebrile vital signs stable    FHT: FHR: 130 bpm, variability: minimal , accelerations: Abscent, decelerations: Absent  UC: none  SVE: Dilation: Closed  Exam by:: dr Richardson Dopp  Labs:  Lab Results   Component  Value  Date    WBC  8.0  03/08/2013    HGB  10.4*  03/08/2013    HCT  32.0*  03/08/2013    MCV  77.7*  03/08/2013    PLT  285  03/08/2013    Assessment / Plan:  29 wk s and 4 days with IUGR/ trisomy 21/ Gestational DM  Ultrasound today shows transient absent and reversed flow BPP is 6/8  Breathing was present but no sustained  Fetus is Breech.  Plan repeat dopplers and BPP tomorrow   20 minute wheelchair ride bid

## 2013-03-12 ENCOUNTER — Inpatient Hospital Stay (HOSPITAL_COMMUNITY)
Admission: RE | Admit: 2013-03-12 | Discharge: 2013-03-12 | Disposition: A | Discharge status: Home / Self Care | Payer: BC Managed Care – PPO | Source: Ambulatory Visit | Attending: Obstetrics and Gynecology | Admitting: Obstetrics and Gynecology

## 2013-03-12 ENCOUNTER — Inpatient Hospital Stay (HOSPITAL_COMMUNITY): Payer: BC Managed Care – PPO

## 2013-03-12 DIAGNOSIS — O358XX0 Maternal care for other (suspected) fetal abnormality and damage, not applicable or unspecified: Secondary | ICD-10-CM

## 2013-03-12 DIAGNOSIS — O351XX1 Maternal care for (suspected) chromosomal abnormality in fetus, fetus 1: Secondary | ICD-10-CM

## 2013-03-12 DIAGNOSIS — IMO0002 Reserved for concepts with insufficient information to code with codable children: Secondary | ICD-10-CM

## 2013-03-12 DIAGNOSIS — O24419 Gestational diabetes mellitus in pregnancy, unspecified control: Secondary | ICD-10-CM

## 2013-03-12 LAB — GLUCOSE, CAPILLARY
Glucose-Capillary: 109 mg/dL — ABNORMAL HIGH (ref 70–99)
Glucose-Capillary: 76 mg/dL (ref 70–99)
Glucose-Capillary: 84 mg/dL (ref 70–99)
Glucose-Capillary: 118 mg/dL — ABNORMAL HIGH (ref 70–99)

## 2013-03-12 NOTE — Progress Notes (Signed)
HD #5 Subjective: no complaints  Denies contractions, LOF, VB.  Active fetus noted.    Objective: Blood pressure 116/62, pulse 84, temperature 98.2 F (36.8 C), temperature source Oral, resp. rate 20, height 5\' 8"  (1.727 m), weight 136.533 kg (301 lb), last menstrual period 08/16/2012, SpO2 98.00%.  Physical Exam:  General: alert, cooperative and no distress Uterine Fundus: nontender DVT Evaluation: TED hose on. Pelvic:  No pooling, cervix with cervical mucus at os, appears thickened.  No abnormal discharge, no odor.  No cervical lesions. SVE 2-3 cm in length, moderate consistency.  External os 1 cm, internal os not reached but feels closed.  BPP verbal-8/8, no measurable cervix, 2.3 cm open  EM:  +ltv, +accels, occasional variable decels.  No contractions.  Fern neg.  No results found for this basename: HGB, HCT,  in the last 72 hours  Assessment/Plan: IUP at 29 5/7 weeks IUGR with abnormal doppler studies Transient absent end diastolic flow.  No reverse flow seen.  BPP previously 6/8.  Currently, 8/8.  NST reassuring and fetus active. -F/u with MFM tomorrow.  Continue daily umbilical dopplers.   -Deliver for reverse diastolic flow, fetal distress. Cervical length abnormal by radiology ultrasound but clinically feels at least 2 cm.  Wet prep, GC/Chl sent.  Fern neg.  F/u MFM tomorrow.  Continue bedrest. Gestational DM-  Blood glucose well controlled on ADA diet. Fetus Breech-C-section for delivery.   LOS: 4 days   [name] 03/12/2013, 6:57 PM

## 2013-03-12 NOTE — Progress Notes (Signed)
GC/Chl and wet prep obtained as well as FERN.  FERN was negative per MD

## 2013-03-12 NOTE — Progress Notes (Signed)
PC to MD to obtain order for cervical length per request of the tech doing the u/s.  Appearing to see no cervical; length

## 2013-03-12 NOTE — Progress Notes (Signed)
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  Findings: 1. Single intrauterine pregnancy. 2. Posterior placenta without evidence of previa. 3. Normal amniotic fluid index. 5.  Umbilical artery Doppler studies have intermittent absent end diastolic flow. No reversal of flow is seen. 6. Normal ductus venosus Doppler studies. 7. Biophysical profile is 6/8 (-2 for breathing). 8. Fetus is in breech presentation.  Recommendations: 1. Fetal growth restriction/abnormal Doppler studies: - previously counseled - s/p betamethasone - recommend continue inpatient management with continuous monitoring (may take brief breaks if tracing is reassuring) - given BPP 6/10 (fetal tracing not reactive) recommend repeat BPP in 6 hours - fetal growth in 1-2 weeks - continue daily Doppler studies 2. 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 AV canal defect: - previously counseled - followed by Pediatric Cardiology - follow up with Pediatric Cardiology in 3 weeks 4. 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 Recommend delivery for labor, nonreassuring fetal tracing, persistent reversal of flow on umbilical artery Doppler studies, no interval fetal growth, or BPP 4/10 or less.  Patient sent back to Labor and Delivery. Results given to Dr. Idamae Schuller.  Eulis Foster, MD

## 2013-03-12 NOTE — Progress Notes (Signed)
Ur chart review completed.  

## 2013-03-12 NOTE — Progress Notes (Signed)
Patient ID: [name], female   DOB: 27-Aug-1986, 28 y.o.   MRN: [id number]  GC/Chl needed to be recollected b/c old endocervical testing modality used, not Aptima. Upon arrival, spontaneous fetal decel noted with good variability.  ~ 40 beats below baseline.  Late deceleration in appearance but it was not associated with a contraction x 4 minutes.  +recovery. Pelvic-no change.  Spontaneous decel-cont continuous monitoring.

## 2013-03-13 ENCOUNTER — Inpatient Hospital Stay (HOSPITAL_COMMUNITY): Payer: BC Managed Care – PPO

## 2013-03-13 LAB — GC/CHLAMYDIA PROBE AMP
CT Probe RNA: NEGATIVE
GC Probe RNA: NEGATIVE

## 2013-03-13 LAB — GLUCOSE, CAPILLARY
Glucose-Capillary: 73 mg/dL (ref 70–99)
Glucose-Capillary: 88 mg/dL (ref 70–99)

## 2013-03-13 MED ORDER — SODIUM CHLORIDE 0.9 % IJ SOLN
3.0000 mL | Freq: Two times a day (BID) | INTRAMUSCULAR | Status: DC
  Administered 2013-03-13 – 2013-03-15 (×4): 3 mL via INTRAVENOUS

## 2013-03-13 NOTE — Progress Notes (Signed)
Fasting CBG performed; monitor stated patient was not valid after scanning 4 times and typing MR # in twice; performed new patient override; fasting CBG result 73.

## 2013-03-13 NOTE — Progress Notes (Signed)
HD #5 Subjective: no complaints  Denies contractions, LOF, VB.  Active fetus noted.    Objective: Blood pressure 120/71, pulse 88, temperature 98.9 F (37.2 C), temperature source Oral, resp. rate 20, height 5\' 8"  (1.727 m), weight 136.533 kg (301 lb), last menstrual period 08/16/2012, SpO2 98.00%.   Physical Exam:  General: alert, cooperative and no distress Uterine Fundus: nontender DVT Evaluation: TED hose on. Pelvic:  Cervix deferred BPP 8/8 this am  EM:  +ltv, +accels, occasional variable decels.  No contractions.  CBGs wnl range Wet prep neg, GC/Chl neg  Assessment/Plan: IUP at 29 6/7 weeks IUGR with abnormal doppler studies Absent end diastolic flow.  No reverse flow seen. Reassuring fetal testing and fetus active.  Discussed today's ultrasound with Dr. Sherrie George.  Recommends intermittent fetal monitoring, NST every 8 hours. -F/u with MFM tomorrow.  Continue daily umbilical dopplers.   -Deliver for reverse diastolic flow, fetal distress. Incompetent cervix- D/w Dr. Sherrie George.  No change to current management plan.  Does not recommend vaginal progesterone at this time.  Continue bedrest. Gestational DM-  Blood glucose well controlled on ADA diet.  Change to BID, alternate with fasting and pp lunch one day and pp breakfast and dinner the next. Fetus Breech-C-section for delivery.   LOS: 5 days   Fordyce Lepak 03/13/2013, 7:36 PM

## 2013-03-14 ENCOUNTER — Inpatient Hospital Stay (HOSPITAL_COMMUNITY): Payer: BC Managed Care – PPO

## 2013-03-14 LAB — GLUCOSE, CAPILLARY: Glucose-Capillary: 88 mg/dL (ref 70–99)

## 2013-03-14 LAB — TYPE AND SCREEN

## 2013-03-14 NOTE — Progress Notes (Addendum)
HD #7  Subjective: no complaints Very active fetus.  Denies contractions, LOF, VB or pressure.  MFM report not available at this time but pt states she was told there was no change.   Plans to take tour of NICU soon. Objective: Blood pressure 126/70, pulse 83, temperature 98.5 F (36.9 C), temperature source Oral, resp. rate 18, height 5\' 8"  (1.727 m), weight 132.224 kg (291 lb 8 oz), last menstrual period 08/16/2012, SpO2 98.00%.  Physical Exam:  General: alert, cooperative and no distress Uterine Fundus: Nontender DVT Evaluation: No evidence of DVT seen on physical exam. TED hose on.  NST:  +ltv.  More accels than previous.  On am NST, more mild variable decels.  No contractions.  CBG within range.  No results found for this basename: HGB, HCT,  in the last 72 hours  Assessment/Plan:  IUP at 30 weeks IUGR with abnormal doppler studies, Likely Trisomy 21. H/o absent end diastolic flow. No reverse flow seen. Reassuring NST and fetus active.  Intermittent fetal monitoring, NST every 8 hours.  Awaiting report from today's ultrasound.  -F/u with MFM tomorrow. Continue daily umbilical dopplers.  -Deliver for reverse diastolic flow, fetal distress.  Incompetent cervix-  Continue bedrest.  Gestational DM- Blood glucose well controlled on ADA diet. BID CBG Fetus Breech-C-section for delivery.   LOS: 6 days   [name] 03/14/2013, 6:29 PM

## 2013-03-15 ENCOUNTER — Inpatient Hospital Stay (HOSPITAL_COMMUNITY): Payer: BC Managed Care – PPO

## 2013-03-15 LAB — GLUCOSE, CAPILLARY

## 2013-03-15 NOTE — Progress Notes (Signed)
[name] is a 27 y.o. G1P0 at [redacted]w[redacted]d admitted with IUGR and absent EDF on dopplers  Subjective: +Fetal Movement. Soreness at bottom of abdomen with standing. No contractions no leakage of fluid   Objective: BP 114/72  Pulse 100  Temp(Src) 98.3 F (36.8 C) (Oral)  Resp 20  Ht 5\' 8"  (1.727 m)  Wt 132.224 kg (291 lb 8 oz)  BMI 44.33 kg/m2  SpO2 98%  LMP 08/16/2012 I/O last 3 completed shifts: In: 3 [I.V.:3] Out: -     FHT:  FHR: 130 bpm, variability: moderate,  accelerations:  Present,  decelerations:  Absent UC:   none SVE:   Dilation: Closed Effacement (%): Thick Exam by:: Dr. Dion Body  Labs: Lab Results  Component Value Date   WBC 8.0 03/08/2013   HGB 10.4* 03/08/2013   HCT 32.0* 03/08/2013   MCV 77.7* 03/08/2013   PLT 285 03/08/2013   Preliminary ultrasound results per nurse BPP 8 out 8 with transient absent end diastolic flow.  Assessment / Plan: 30wks and 1 day with IUGR and elevated dopplers followed by MFM Daily dopplers and bpp A1 DM well controlled  Cervical shortening ... Stable continue bedrest with bathroom privileges     Jeris Easterly J. 03/15/2013, 5:38 PM

## 2013-03-16 ENCOUNTER — Ambulatory Visit (HOSPITAL_COMMUNITY): Payer: BC Managed Care – PPO

## 2013-03-16 ENCOUNTER — Inpatient Hospital Stay (HOSPITAL_COMMUNITY): Payer: BC Managed Care – PPO

## 2013-03-16 LAB — GLUCOSE, CAPILLARY
Glucose-Capillary: 68 mg/dL — ABNORMAL LOW (ref 70–99)
Glucose-Capillary: 79 mg/dL (ref 70–99)

## 2013-03-16 NOTE — Progress Notes (Signed)
Pt off the monitor after reassurring FHR  

## 2013-03-16 NOTE — Progress Notes (Signed)
Nutrition: Nutrition  Consult for pt wishing more food/hungry. CBG's are wnl  Pt has been allowed double protein portions at meals. Have increased the CHO limit at breakfast to 45 grams, 60 grams at lunch and dinner.  [name] M.Odis Luster LDN Neonatal Nutrition Support Specialist Pager [phone]

## 2013-03-16 NOTE — Progress Notes (Signed)
Pt taken off the monitor after reassurring FHR  

## 2013-03-16 NOTE — Progress Notes (Signed)
TO MFM via w/c

## 2013-03-16 NOTE — Progress Notes (Signed)
Pt has visitors at this time

## 2013-03-16 NOTE — Progress Notes (Signed)
[name] is a 27 y.o. G1P0 at [redacted]w[redacted]d  Subjective: Pt without complaints.  Active fetus.  Denies contractions.  Objective: BP 133/81  Pulse 74  Temp(Src) 98.2 F (36.8 C) (Oral)  Resp 20  Ht 5\' 8"  (1.727 m)  Wt 132.224 kg (291 lb 8 oz)  BMI 44.33 kg/m2  SpO2 98%  LMP 08/16/2012      FHT:  +LTV UC:   none SVE:   Dilation: Closed Effacement (%): Thick Exam by:: Dr. 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  H/o absent end diastolic flow. No reverse flow seen today. Reassuring NST and fetus active. Intermittent fetal monitoring, NST every 8 hours.  -Continue daily umbilical dopplers with radiology on Sat and Sun. -Deliver for reverse diastolic flow, fetal distress.  Incompetent cervix- Continue bedrest.  Gestational DM- Blood glucose well controlled on ADA diet. BID CBG  Fetus Breech-C-section for delivery.   Geryl Rankins 03/16/2013, 7:01 PM

## 2013-03-17 ENCOUNTER — Inpatient Hospital Stay (HOSPITAL_COMMUNITY): Payer: BC Managed Care – PPO

## 2013-03-17 LAB — TYPE AND SCREEN: Antibody Screen: NEGATIVE

## 2013-03-17 LAB — GLUCOSE, CAPILLARY: Glucose-Capillary: 85 mg/dL (ref 70–99)

## 2013-03-17 MED ORDER — DOCUSATE SODIUM 100 MG PO CAPS
100.0000 mg | ORAL_CAPSULE | Freq: Two times a day (BID) | ORAL | Status: DC
  Administered 2013-03-17 – 2013-04-03 (×34): 100 mg via ORAL
  Filled 2013-03-17 (×35): qty 1

## 2013-03-17 NOTE — Progress Notes (Signed)
HD #6  Subjective: no complaints Very active fetus.   Pt s/p Nutrition consult.  Continue double protein, carbs limits increased to preadmission recommendations.  Baby was breathing per pt but it was not sustained.  Objective: AFVSS  Physical Exam:  General: alert, cooperative and no distress Uterine Fundus: Nontender DVT Evaluation: No evidence of DVT seen on physical exam. TED hose on.  NST:  +ltv.  Accels present.  No contractions.  CBG within range.  BPP 6/8.  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 H/o absent end diastolic flow. No reverse flow seen. Reassuring NST and fetus active.  Intermittent fetal monitoring, NST every 8 hours.   NST for 1 hour instead of 30 minutes ordered.  Today's ultrasound 1/7 absent diastolic flow.   Repeat dopplers tomorrow. -Deliver for reverse diastolic flow, fetal distress.  Incompetent cervix-  Continue bedrest.  Gestational DM- Blood glucose well controlled on ADA diet. BID CBG.  Monitor weight once weekly.  Appreciate timely nutrition recommendations. Fetus Breech-C-section for delivery. DC type and screen q 72 hours. Plan d/w pt and family at length.   LOS: 9 days   [name] 03/17/2013, 1:55 PM

## 2013-03-17 NOTE — Progress Notes (Signed)
Dr Dion Body called after reviewing pt fhr strip form earlier today, to put pt on monitor now and she is ordering another BPP for tonight

## 2013-03-18 ENCOUNTER — Inpatient Hospital Stay (HOSPITAL_COMMUNITY): Payer: BC Managed Care – PPO

## 2013-03-18 LAB — GLUCOSE, CAPILLARY: Glucose-Capillary: 114 mg/dL — ABNORMAL HIGH (ref 70–99)

## 2013-03-18 NOTE — Progress Notes (Signed)
Fetal monitoring interrupted to go to U/S

## 2013-03-18 NOTE — Progress Notes (Signed)
Will resume monitoring after pt eats breakfast. Reported bpp 8/8 from U/S tech

## 2013-03-18 NOTE — Progress Notes (Signed)
Out for W/C ride with significant other.

## 2013-03-19 ENCOUNTER — Inpatient Hospital Stay (HOSPITAL_COMMUNITY)
Admission: RE | Admit: 2013-03-19 | Discharge: 2013-03-19 | Disposition: A | Discharge status: Home / Self Care | Payer: BC Managed Care – PPO | Source: Ambulatory Visit | Attending: Obstetrics and Gynecology | Admitting: Obstetrics and Gynecology

## 2013-03-19 ENCOUNTER — Inpatient Hospital Stay (HOSPITAL_COMMUNITY): Payer: BC Managed Care – PPO

## 2013-03-19 DIAGNOSIS — O358XX0 Maternal care for other (suspected) fetal abnormality and damage, not applicable or unspecified: Secondary | ICD-10-CM

## 2013-03-19 DIAGNOSIS — O351XX1 Maternal care for (suspected) chromosomal abnormality in fetus, fetus 1: Secondary | ICD-10-CM

## 2013-03-19 DIAGNOSIS — IMO0002 Reserved for concepts with insufficient information to code with codable children: Secondary | ICD-10-CM

## 2013-03-19 DIAGNOSIS — O24419 Gestational diabetes mellitus in pregnancy, unspecified control: Secondary | ICD-10-CM

## 2013-03-19 LAB — GLUCOSE, CAPILLARY
Glucose-Capillary: 97 mg/dL (ref 70–99)
Glucose-Capillary: 111 mg/dL — ABNORMAL HIGH (ref 70–99)

## 2013-03-19 NOTE — Progress Notes (Signed)
Dr. Dion Body notified of u/s report from this morning.  No orders at this time.  MD to call and talk with Dr. Claudean Severance concerning fetal monitoring.

## 2013-03-19 NOTE — Consult Note (Signed)
Dear  Colleagues,  I had the pleasure of seeing [name] for initial consultation in [facility] (as inpatient), but we have seen her previously in the [place] office of the Fetal Heart Program at [facility]. As you know, she is a 27 y/o G1P0 currently at ~ [redacted] weeks gestation.  She had previously been referred to our clinic because OB ultrasound was suspicious for possible fetal heart defect.  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  Subsequently, this baby has been diagnosed with Down Syndrome through [facility].    This pregnancy was conceived naturally. There have been no significant intercurrent illnesses.  She denies any exposure to tobacco, alcohol or recreational drugs.  There is no family history of congenital heart disease, birth defects, genetic abnormalities, arrhythmia, pacemakers or sudden cardiac death.  She has been hospitalized for close observation and serial fetal ultrasounds because of worsening IUGR and abnormal Doppler umbilical flows.  A partial Fetal Echocardiogram with Doppler assessment was performed today in [name]'s hospital room.  This f/u study was focused on evaluation of ventricular size and function, great artery symmetry and AV valve regurgitation.  I personally interpreted and reviewed this study with [name].  Please refer to a copy of my final report in [facility] if you would like to review further details from that study.  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 Ventricles are balanced and demonstrate normal systolic function. Common AV Valve with minimal regurgitation.  No conotruncal defects and great arteries are symmetrically sized without signs of obstruction.  Normal fetal heart rate and rhythm were also normal with no ectopy or arrhythmia identified.  We did not perform Doppler studies in the umbilical vessels since this is already  being followed by OB service.  These findings were discussed with [name].  I told her that there is no need to alter delivery plans because of the presence of this heart defect.  Method and timing of delivery to be determined by maternal and fetal conditions independent of this defect.  This is NOT a ductal dependant lesion.  i do not expect this defect, in itself, to require special resuscitative measures after delivery.  Lung maturity and prematurity issues likely will dominate initial resuscitative efforts.  This defect typically has saturations that run 92 - 94% (or higher).  Supplemental oxygen should be provided if pulse oximetry runs < 85% (and only then to maintain sats into low 90's).  After delivery, I would like to be contacted so that I can arrange echocardiogram post-delivery.  Phone: [phone], Pager: [phone].  This can be done routinely on following day if she is otherwise stable.  Thank you for involving me in the care of your patient today.   Sincerely,  Massie Bougie, MD Pediatric Cardiology [facility]  Total Time: 40 minutes. Time of Counseling/Coordinating care: 30 minutes.

## 2013-03-19 NOTE — Progress Notes (Signed)
Pt taken off the monitor after reassurrring FHR

## 2013-03-19 NOTE — Progress Notes (Signed)
[name]  was seen today for an ultrasound appointment.  See full report in [facility].  Impression: IUP at 30+5 weeks T 21 by cffDNA; FGR; AV canal heart defect, inferior hypoplasia of the cerebellar vermis Normal amniotic fluid volume  Estimated fetal weight at the 14th %tile on 5/2 UA dopplers: several tracing with reversed flow; others with absent diastolic flow.  One single tracing with some diastolic flow. Normal Ductus venosus waveform. Middle Cerebral Artery PI at the 3rd %tile consistent with cephalization Active fetus with BPP of 8/8  Recommendations: Recommend continuous fetal monitoring. Daily UA Dopplers and BPPs.  Would move toward delivery for: persisently reversed flow on UA Dopplers, BPP 4/8 or non-reassuring fetal tracing  Alpha Gula, MD

## 2013-03-19 NOTE — Progress Notes (Addendum)
HD #12   Subjective: no complaints Very active fetus.  No contractions. Objective: AFVSS  Physical Exam:  General: alert, cooperative and no distress Uterine Fundus: Nontender DVT Evaluation: No evidence of DVT seen on physical exam. TED hose not on.  NST:  +ltv.  Accels present.  No contractions. Occ spontaneous decel but good variability during decel.  No contractions.  CBG within range.  97 at 9 am was not fasting, 1  Hour pp.  BPP 8/8.  Intermittent reverse diastolic flow.  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 Worsening doppler studies. H/o absent end diastolic flow. Now intermittent reverse flow seen but not sustained. Reassuring BPP and fetus active.  Intermittent fetal monitoring, NST x 1 hour every 8 hours.   D/w Dr. Harlon Flor and feels intermittent NST as above are still appropriate.  Repeat dopplers tomorrow. F/u fetal echo to be done soon, hopefully today.  D/w Dr. Harlon Flor and cardiologist was updated on change of status and timely echo encouraged.  Previously cleared for delivery at [facility] but if cardiac status has worsened delivery may need to be done at [facility] for fetal indication.  Discussed with pt if that were the case, she would be transferred ASAP b/c delivery may be within the next few days given changes noted on doppler studies today. -Deliver for persistent reverse diastolic flow, fetal distress.  Incompetent cervix-  Continue bedrest.  Gestational DM- Blood glucose previously well controlled on ADA diet but carb limits recently increased. Increase to QID CBG.  Monitor weight once weekly.   Fetus Breech-C-section for delivery. Plan d/w pt at length. Plan of care d/w Dr. Harlon Flor.   LOS: 11 days   Valiant Dills 03/19/2013, 1:43 PM

## 2013-03-19 NOTE — Progress Notes (Signed)
Received PC from MD that we were going to continue one hour TID

## 2013-03-19 NOTE — Progress Notes (Signed)
Monitor off after reassurring FHR   

## 2013-03-20 ENCOUNTER — Inpatient Hospital Stay (HOSPITAL_COMMUNITY): Payer: BC Managed Care – PPO

## 2013-03-20 LAB — GLUCOSE, CAPILLARY
Glucose-Capillary: 79 mg/dL (ref 70–99)
Glucose-Capillary: 94 mg/dL (ref 70–99)
Glucose-Capillary: 128 mg/dL — ABNORMAL HIGH (ref 70–99)

## 2013-03-20 LAB — TYPE AND SCREEN
ABO/RH(D): O POS
Antibody Screen: NEGATIVE

## 2013-03-20 NOTE — Progress Notes (Signed)
[name] is a 27 y.o. G1P0 at [redacted]w[redacted]d  admitted for IUGR, absent diastolic flow.   HD #13  Subjective: Pt without complaints.  Active fetus.  Denies LOF, VB, contractions.  Still having difficulty ordering 60 gram  lunch and dinner despite correction with DM nurse, Lenor Coffin.  Objective: BP 109/71  Pulse 112  Temp(Src) 98.4 F (36.9 C) (Oral)  Resp 20  Ht 5\' 8"  (1.727 m)  Wt 131.6 kg (290 lb 2 oz)  BMI 44.12 kg/m2  SpO2 96%  LMP 08/16/2012      FHT:  FHR: 150s bpm, variability: moderate,  accelerations:  Present,  decelerations:  Absent UC:   none SVE:   Dilation: Closed Effacement (%): Thick Exam by:: Dr. Dion Body  MFM ultrasound: BPP 8/8.  No absent or reverse flow seen today.  Breech. Labs: Lab Results  Component Value Date   WBC 8.0 03/08/2013   HGB 10.4* 03/08/2013   HCT 32.0* 03/08/2013   MCV 77.7* 03/08/2013   PLT 285 03/08/2013    Assessment / Plan:  IUP at 30 3/7weeks 1.  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 H/o absent end diastolic flow. 2. 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  GDM- Well controlled with diet. 4.  Morbid obesity on bedrest.   1.Reassuring BPP and fetal activity. Intermittent fetal monitoring, NST x 1 hour every 8 hours. Repeat dopplers dai.  Deliver for persistent reverse diastolic flow, fetal distress. Apprecitate inpatient cardiology evaluation.  Defect stable.  Cleared for delivery at this hospital. 2. Continue bedrest.  3.  CBG QID now that carb limit has been increased.  Requested RN modify order for 60 gram lunch and dinner, 45 gram breakfast.  Monitor weight once weekly.  4.  TED hose to reduce risk of DVT. Fetus Breech-C-section for delivery.     Geryl Rankins 03/20/2013, 7:15 PM

## 2013-03-20 NOTE — Progress Notes (Signed)
Paislei Lassiter  was seen today for an ultrasound appointment.  See full report in [facility].  Impression: IUP at 30+5 weeks T 21 by cffDNA; FGR; AV canal heart defect, inferior hypoplasia of the cerebellar vermis Normal amniotic fluid volume  Estimated fetal weight at the 14th %tile on 5/2 UA dopplers: Elevated S/D ratio but no evidence of absent or reversed diastolic flow Active fetus with BPP of 8/8  Recommendations: Daily UA Dopplers and BPPs.  Would move toward delivery for: persisently reversed flow on UA Dopplers, BPP 4/8 or non-reassuring fetal tracing  Alpha Gula, MD

## 2013-03-20 NOTE — Progress Notes (Signed)
Diabetes Treatment Program Recommendations  ADA Standards of Care 2012 Diabetes in Pregnancy Target Glucose Ranges:  Fasting: 60 - 90 mg/dL Preprandial: 60 - 454 mg/dL 1 hr postprandial: Less than 140mg /dL (from first bite of meal) 2 hr postprandial: Less than 120 mg/dL (from first bit of meal)  Talked with patient with good glucose control.  She has 60 gram dinner and lunch and 45 gram breakfast;however pt continued to get 50 grams lunch and dinner. Corrected per Almedia Balls. Thank you, Lenor Coffin, RN, CNS, Cde [phone])

## 2013-03-21 ENCOUNTER — Inpatient Hospital Stay (HOSPITAL_COMMUNITY): Payer: BC Managed Care – PPO

## 2013-03-21 ENCOUNTER — Ambulatory Visit (HOSPITAL_COMMUNITY): Payer: BC Managed Care – PPO

## 2013-03-21 LAB — GLUCOSE, CAPILLARY: Glucose-Capillary: 113 mg/dL — ABNORMAL HIGH (ref 70–99)

## 2013-03-21 NOTE — Progress Notes (Signed)
[name]  was seen today for an ultrasound appointment.  See full report in [facility].  Impression: IUP at 31 0/7 weeks T 21 by cffDNA; FGR; AV canal heart defect, inferior hypoplasia of the cerebellar vermis Normal amniotic fluid volume  Repeat BPP 8/8  Recommendations: Daily UA Dopplers and BPPs.  Would move toward delivery for: persisently reversed flow on UA Dopplers, BPP 4/8 or non-reassuring fetal tracing  Alpha Gula, MD

## 2013-03-21 NOTE — Progress Notes (Addendum)
[name] is a 27 y.o. G1P0 at [redacted]w[redacted]d  admitted for IUGR, absent diastolic flow.   HD #14  Subjective: Pt without complaints.  Active fetus.  Denies LOF, VB, contractions.  Breakfast carb limit correct but not lunch.  Objective: BP 119/68  Pulse 91  Temp(Src) 98.3 F (36.8 C) (Oral)  Resp 18  Ht 5\' 8"  (1.727 m)  Wt 131.6 kg (290 lb 2 oz)  BMI 44.12 kg/m2  SpO2 96%  LMP 08/16/2012      FHT:  140s, +LTV, accels.  Mild variable decel UC:   none  MFM ultrasound: BPP 6/8.  No absent or reverse flow seen today.  Breech. Recommended repeat BPP this afternoon.  CBGs Fasting 90, 2 PP lunch Labs: Lab Results  Component Value Date   WBC 8.0 03/08/2013   HGB 10.4* 03/08/2013   HCT 32.0* 03/08/2013   MCV 77.7* 03/08/2013   PLT 285 03/08/2013    Assessment / Plan:  IUP at 30 4/7weeks 1.  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 H/o absent end diastolic flow. 2. 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  GDM- Well controlled with diet. 4.  Morbid obesity on bedrest.   1. BPP 6/8 but NST and fetal activity reassuring.. Intermittent fetal monitoring, NST x 1 hour every 8 hours. Repeat BPP this afternoon as recommended per MFM. Deliver for persistent reverse diastolic flow, fetal distress.  2. Continue bedrest.  3.  CBG QID. Requested RN modify order for 60 gram lunch and dinner  Monitor weight once weekly.  4.  TED hose to reduce risk of DVT. Fetus Breech-C-section for delivery.     Geryl Rankins 03/21/2013, 6:03 PM

## 2013-03-21 NOTE — Progress Notes (Signed)
[name]  was seen today for an ultrasound appointment.  See full report in [facility].  Impression: IUP at 31 0/7 weeks T 21 by cffDNA; FGR; AV canal heart defect, inferior hypoplasia of the cerebellar vermis Normal amniotic fluid volume  Estimated fetal weight at the 14th %tile on 5/2 UA dopplers: Elevated S/D ratio but no evidence of absent or reversed diastolic flow BPP 6/8 (-2 for non-sustained fetal breathing activity)  Recommendations: Please perform NST on return to antepartum unit.  Will repeat BPP this afternoon. Continue daily UA Doppler studies and BPPs.  Alpha Gula, MD

## 2013-03-22 ENCOUNTER — Ambulatory Visit (HOSPITAL_COMMUNITY): Payer: BC Managed Care – PPO

## 2013-03-22 ENCOUNTER — Inpatient Hospital Stay (HOSPITAL_COMMUNITY): Payer: BC Managed Care – PPO

## 2013-03-22 LAB — GLUCOSE, CAPILLARY: Glucose-Capillary: 118 mg/dL — ABNORMAL HIGH (ref 70–99)

## 2013-03-22 NOTE — Progress Notes (Signed)
Returning from NICU tour

## 2013-03-22 NOTE — Progress Notes (Signed)
Pt returning from MFM  

## 2013-03-22 NOTE — Progress Notes (Signed)
Pt taken off the monitor after reassurring FHR  

## 2013-03-22 NOTE — Progress Notes (Signed)
[name] is a 27 y.o. G1P0 at [redacted]w[redacted]d  admitted for IUGR, absent diastolic flow.   HD #15  Subjective: Pt without complaints.  Active fetus.  Denies LOF, VB, contractions.   Objective: AFVSS    Gen:  NAD, A&O Abdomen:  Nontender Ext.  No TED hose on. FHT:  140s, +LTV, accels.  Mild variable decel UC:   none  MFM ultrasound: BPP 8/8.  Forward diastolic, absent and reverse flow seen today.  Breech.   CBGs PP dinner 143, Fasting 93  Labs: Lab Results  Component Value Date   WBC 8.0 03/08/2013   HGB 10.4* 03/08/2013   HCT 32.0* 03/08/2013   MCV 77.7* 03/08/2013   PLT 285 03/08/2013    Assessment / Plan:  IUP at 30 5/7weeks 1.  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 H/o absent end diastolic flow now with intermittent reverse flow.. 2. 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  GDM- Well controlled with diet. 4.  Morbid obesity on bedrest.   1. BPP 8/8, NST and fetal activity reassuring.. Intermittent fetal monitoring, NST x 1 hour every 8 hours. Deliver for persistent reverse diastolic flow, fetal distress.  2. Continue bedrest.  3.  CBG QID. Increasing with increased carb diet.  Monitor closely start Glyburide if consistently out of range.  4.  TED hose to reduce risk of DVT.  Encouraged to wear during the day, may take off when sleeping at night. Fetus Breech-C-section for delivery.  Dr. Richardson Dopp to round tomorrow and on weekend.  Pt aware.     [name] 03/22/2013, 1:18 PM

## 2013-03-23 ENCOUNTER — Inpatient Hospital Stay (HOSPITAL_COMMUNITY): Payer: BC Managed Care – PPO

## 2013-03-23 LAB — GLUCOSE, CAPILLARY
Glucose-Capillary: 74 mg/dL (ref 70–99)
Glucose-Capillary: 101 mg/dL — ABNORMAL HIGH (ref 70–99)
Glucose-Capillary: 152 mg/dL — ABNORMAL HIGH (ref 70–99)

## 2013-03-23 NOTE — Progress Notes (Signed)
Pt off the monitor after reassurrign FHR

## 2013-03-23 NOTE — Progress Notes (Signed)
[name]  was seen today for an ultrasound appointment.  See full report in [facility].  Impression: IUP at 31 2/7 weeks T 21 by cffDNA; FGR; AV canal heart defect, inferior hypoplasia of the cerebellar vermis Normal amniotic fluid volume  Estimated fetal weight at the 14th %tile on 5/2 UA dopplers: One tracing with diastolic flow (but elevated S/D ratio).  Two tracings with absent diastolic flow; no reversed diastolic flow BPP 8/8  Fetal echo report in [facility] - cleared for delivery at [facility].  Will need echo and evaluation after delivery.  Recommendations: Daily UA Dopplers and BPPs.  Would move toward delivery for: persisently reversed flow on UA Dopplers, BPP 4/8 or non-reassuring fetal tracing If testing otherwise remains stable, would move for delivery at 34 weeks.  Alpha Gula, MD

## 2013-03-23 NOTE — Progress Notes (Signed)
UR chart review completed.  

## 2013-03-23 NOTE — Progress Notes (Signed)
[name] is a 27 y.o. G1P0 at 31 wks and 2 days d admitted for IUGR, absent diastolic flow.  HD #15  Subjective:  Pt without complaints. Active fetus. Denies LOF, VB, contractions.  Objective:  AFVSS   Gen: NAD, A&O  Abdomen: Nontender  Ext. No TED hose on.  FHT: 140s, +LTV, accels. Mild variable decel  UC: none  MFM ultrasound: BPP 8/8. Forward diastolic, absent and reverse flow seen today. Breech.  CBGs PP dinner 143, Fasting 93  Labs:  Lab Results   Component  Value  Date    WBC  8.0  03/08/2013    HGB  10.4*  03/08/2013    HCT  32.0*  03/08/2013    MCV  77.7*  03/08/2013    PLT  285  03/08/2013    Assessment / Plan:  IUP at 31 wks and 2 days  1. 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 H/o absent end diastolic flow now with intermittent absent/ reversed  flow..  2. 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 GDM- Well controlled with diet.  4. Morbid obesity on bedrest.  1. BPP 8/8, NST and fetal activity reassuring.. Intermittent fetal monitoring, NST x 1 hour every 8 hours. Deliver for persistent reverse diastolic flow, fetal distress.  2. Continue bedrest.  3. CBG QID. Increasing with increased carb diet. Monitor closely start Glyburide if consistently out of range.  4. TED hose to reduce risk of DVT. Encouraged to wear during the day, may take off when sleeping at night.  Fetus Breech-C-section for delivery.

## 2013-03-24 ENCOUNTER — Inpatient Hospital Stay (HOSPITAL_COMMUNITY): Payer: BC Managed Care – PPO

## 2013-03-24 LAB — GLUCOSE, CAPILLARY
Glucose-Capillary: 99 mg/dL (ref 70–99)
Glucose-Capillary: 91 mg/dL (ref 70–99)
Glucose-Capillary: 139 mg/dL — ABNORMAL HIGH (ref 70–99)

## 2013-03-24 NOTE — Progress Notes (Addendum)
[name] is a 27 y.o. G1P0 at 31 wks and 2 days d admitted for IUGR, absent diastolic flow.  HD #15  Subjective:  Pt without complaints. Active fetus. Denies LOF, VB, contractions.  Objective:  AFVSS  Gen: NAD, A&O  Abdomen: Nontender  Ext. No TED hose on.  FHT: 120s,  Moderate varibility + accels .Marland Kitchen Rare variable decel noted  UC: none  MFM ultrasound: BPP 8/8. Forward diastolic, absent and reverse flow seen today. Breech.    Labs:  Lab Results   Component  Value  Date    WBC  8.0  03/08/2013    HGB  10.4*  03/08/2013    HCT  32.0*  03/08/2013    MCV  77.7*  03/08/2013    PLT  285  03/08/2013   Assessment / Plan:  IUP at 31 wks and 3 days  1. 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 H/o  intermettent absent / reversed flow.. Awaiting doppler results and bpp results for today  2. 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 GDM- Well controlled with diet.  4. Morbid obesity on bedrest.  5 Breech presentation   1. BPP 8/8 yesterdary , NST and fetal activity reassuring.. Awaiting results from today  Intermittent fetal monitoring, NST x 1 hour every 8 hours. Deliver for persistent reverse diastolic flow, BPP 4 out 8 or nonreassuring fetal heart rate.. If pt remains stable MFM recommends delivery at 34 wks.  2. Continue bedrest.  3. CBG QID. Increasing with increased carb diet. Monitor closely start Glyburide if consistently out of range.  4. TED hose to reduce risk of DVT.  5 Fetus Breech-C-section for delivery.

## 2013-03-25 ENCOUNTER — Inpatient Hospital Stay (HOSPITAL_COMMUNITY): Payer: BC Managed Care – PPO

## 2013-03-25 LAB — GLUCOSE, CAPILLARY

## 2013-03-25 NOTE — Progress Notes (Signed)
Fasting CBG taken with glucometer was 84.  Results not transferring to patient's chart.

## 2013-03-25 NOTE — Progress Notes (Signed)
[name] is a 27 y.o. G1P0 at 31 wks and 4 days admitted for IUGR, absent diastolic flow.  HD #15  Subjective:  Pt without complaints. Active fetus. Denies LOF, VB, contractions.  Objective:  AFVSS  Gen: NAD, A&O  Abdomen: Nontender  Ext. No TED hose on.  FHT: 130s, Moderate varibility + accels .Marland Kitchen Rare variable decel noted  UC: none   ultrasound: BPP 8/8.   absent and transient reverse flow seen today. Cephalic presentation on ultrasound today.   Labs:  Lab Results   Component  Value  Date    WBC  8.0  03/08/2013    HGB  10.4*  03/08/2013    HCT  32.0*  03/08/2013    MCV  77.7*  03/08/2013    PLT  285  03/08/2013    CBG this AM fasting 84  Assessment / Plan:   IUP at 31 wks and 4 days  1. 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 H/o intermettent absent / reversed flow..2. 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 GDM- Well controlled with diet.  4. Morbid obesity on bedrest.  5 Cephalic presentation   1. BPP 8/8  , NST and fetal activity reassuring.. TRansient reversed flow on dopplers today...  NST x 1 hour every 8 hours. Deliver for persistent reverse diastolic flow, BPP 4 out 8 or nonreassuring fetal heart rate.. If pt remains stable MFM recommends delivery at 34 wks. Daily BPP and dopplers  2. Continue bedrest.  3. CBG well controlled...  Monitor closely start Glyburide if consistently out of range.  4. TED hose to reduce risk of DVT.

## 2013-03-26 ENCOUNTER — Inpatient Hospital Stay (HOSPITAL_COMMUNITY)
Admission: RE | Admit: 2013-03-26 | Discharge: 2013-03-26 | Disposition: A | Discharge status: Home / Self Care | Payer: BC Managed Care – PPO | Source: Ambulatory Visit | Attending: Obstetrics and Gynecology | Admitting: Obstetrics and Gynecology

## 2013-03-26 DIAGNOSIS — O358XX0 Maternal care for other (suspected) fetal abnormality and damage, not applicable or unspecified: Secondary | ICD-10-CM

## 2013-03-26 DIAGNOSIS — O351XX1 Maternal care for (suspected) chromosomal abnormality in fetus, fetus 1: Secondary | ICD-10-CM

## 2013-03-26 DIAGNOSIS — IMO0002 Reserved for concepts with insufficient information to code with codable children: Secondary | ICD-10-CM

## 2013-03-26 DIAGNOSIS — O24419 Gestational diabetes mellitus in pregnancy, unspecified control: Secondary | ICD-10-CM

## 2013-03-26 LAB — GLUCOSE, CAPILLARY: Glucose-Capillary: 144 mg/dL — ABNORMAL HIGH (ref 70–99)

## 2013-03-26 LAB — TYPE AND SCREEN: ABO/RH(D): O POS

## 2013-03-26 MED ORDER — FAMOTIDINE 20 MG PO TABS
20.0000 mg | ORAL_TABLET | Freq: Two times a day (BID) | ORAL | Status: DC
  Administered 2013-03-26 – 2013-04-03 (×15): 20 mg via ORAL
  Filled 2013-03-26 (×16): qty 1

## 2013-03-26 MED ORDER — ONDANSETRON HCL 4 MG PO TABS
4.0000 mg | ORAL_TABLET | Freq: Three times a day (TID) | ORAL | Status: DC | PRN
  Administered 2013-03-26: 4 mg via ORAL
  Filled 2013-03-26: qty 1

## 2013-03-26 NOTE — Progress Notes (Signed)
TC to Dr Dion Body for pt's request for nausea at this time.  Also reported GBG result.   See orders.

## 2013-03-26 NOTE — Progress Notes (Signed)
[name] is a 27 y.o. G1P0 at [redacted]w[redacted]d  admitted for IUGR, absent diastolic flow.   HD #19  Subjective: Pt without complaints.  Very active fetus.  Felt that pp breakfast CBG was elevated due to maple sausage instead of her regular sausage.   Objective: AFVSS    Gen:  NAD, A&O Abdomen:  Nontender Ext.  TED hose on. FHT:  130s, +LTV, accels. Spontaneous decel x 1 min, mostly variable in nature, good variability during decel UC:   none  MFM ultrasound: BPP 8/8. Elevated dopplers but no absent or reverse flow seen today.  Cephalic.   CBGs PP dinner 143, Fasting 93  Labs: Lab Results  Component Value Date   WBC 8.0 03/08/2013   HGB 10.4* 03/08/2013   HCT 32.0* 03/08/2013   MCV 77.7* 03/08/2013   PLT 285 03/08/2013   CBG 86 Fasting, 134 pp breakfast  Assessment / Plan:  IUP at 31 5/7weeks 1.  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 H/o absent end diastolic flow-none seen on today's ultrasound. 2. 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  GDM- Mildly elevated CBG.  Less than 1 week on higher carb diet.  Pt with limited mobility so she does not have many opportunities to decrease blood sugar with activity. 4.  Morbid obesity on bedrest.   1. BPP 8/8, NST and fetal activity reassuring. Intermittent fetal monitoring, NST x 1 hour every 8 hours. Deliver for persistent reverse diastolic flow, fetal distress.  Per MFM recommendations, deliver at 34 weeks. 2. Continue bedrest.  3.  CBG QID. Consult PT for recommendations on arm movements to increase activity.  Will start Glyburide 1.25 mg if bs consistently elevated at 1 week of new diet.  4.  TED hose to reduce risk of DVT.  Encouraged to wear during the day, may take off when sleeping at night. Fetus now cephalic. Candidate for a trial of labor if NST reassuring at time of induction.    Geryl Rankins 03/26/2013, 6:18 PM

## 2013-03-26 NOTE — Progress Notes (Signed)
Kataleyah Lassiter  was seen today for an ultrasound appointment.  See full report in [facility].  Impression: IUP at 31 5/7 weeks T 21 by cffDNA; FGR; AV canal heart defect, inferior hypoplasia of the cerebellar vermis Normal amniotic fluid volume  Estimated fetal weight at the 14th %tile on 5/2 UA dopplers: Elevated UA Doppler studies, but no evidence of reversed or absent diastolic flow BPP 8/8  Recommendation: Daily UA Dopplers and BPPs. Recommend ultrasound for growth on 5/16  Would move toward delivery for: persisently reversed flow on UA Dopplers, BPP 4/8 or non-reassuring fetal tracing, poor interval fetal growth If testing otherwise remains stable, would move for delivery at 34 weeks.  Alpha Gula, MD

## 2013-03-26 NOTE — Progress Notes (Signed)
Ur chart review completed.  

## 2013-03-27 ENCOUNTER — Inpatient Hospital Stay (HOSPITAL_COMMUNITY): Payer: BC Managed Care – PPO

## 2013-03-27 LAB — GLUCOSE, CAPILLARY
Glucose-Capillary: 79 mg/dL (ref 70–99)
Glucose-Capillary: 112 mg/dL — ABNORMAL HIGH (ref 70–99)
Glucose-Capillary: 123 mg/dL — ABNORMAL HIGH (ref 70–99)

## 2013-03-27 MED ORDER — GLYBURIDE 1.25 MG PO TABS: 1.2500 mg | ORAL_TABLET | Freq: Every day | ORAL | Status: DC

## 2013-03-27 NOTE — Progress Notes (Signed)
Pt off the monitor after reassurring FHR  

## 2013-03-27 NOTE — Progress Notes (Signed)
Pt returning from MFM

## 2013-03-27 NOTE — Progress Notes (Addendum)
[name] is a 27 y.o. G1P0 at [redacted]w[redacted]d  admitted for IUGR, absent diastolic flow.   HD #20  Subjective: Pt without complaints.  Very active fetus.  No contractions or pressure.  Small amount of vaginal mucus seen. No watery or blood tinged.  Had nausea overnight b/c she felt like she drank too much water. Increased thirst.  Pepcid and Zofran ordered.  Objective: AFVSS Few episodes of tachycardia   Gen:  NAD, A&O Abdomen:  Nontender Ext.  TED hose off. FHT:  130s, +LTV, more accels. Variable decel, moderate x 1 on bedtime NST.  UC:   none  MFM ultrasound: Pending   CBGs PP lunch 109, Dinner 128, Fasting pending  Labs: Lab Results  Component Value Date   WBC 8.0 03/08/2013   HGB 10.4* 03/08/2013   HCT 32.0* 03/08/2013   MCV 77.7* 03/08/2013   PLT 285 03/08/2013   CBG 86 Fasting, 134 pp breakfast  Assessment / Plan:  IUP at 31 6/7weeks 1.  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 H/o absent end diastolic flow 2. Short Cervix (2.2 cm on 03/16/13).  Scant mucoid discharge. 3.  GDM- Mildly elevated CBG.  Less than 1 week on higher carb diet.  Pt with limited mobility so she does not have many opportunities to decrease blood sugar with activity. 4.  Morbid obesity on bedrest.   1. To MFM for dopplers this am. Continure intermittent fetal monitoring, NST x 1 hour every 8 hours. Deliver for persistent reverse diastolic flow, fetal distress.  Per MFM recommendations, deliver at 34 weeks. 2. Continue bedrest.  3.  CBG QID. Consult PT today for recommendations on arm movements to increase activity.  Will start Glyburide 1.25 mg in am.  4.  TED hose to reduce risk of DVT.  Encouraged to wear during the day, may take off when sleeping at night. 5. Pt counseled on mucus plug and s/sxs of PTL. Fetus now cephalic. Candidate for a trial of labor if NST reassuring at time of induction.    Geryl Rankins 03/27/2013, 8:32 AM

## 2013-03-27 NOTE — Progress Notes (Signed)
[name]  was seen today for an ultrasound appointment.  See full report in [facility].  Impression: IUP at 31 5/7 weeks T 21 by cffDNA; FGR; AV canal heart defect, inferior hypoplasia of the cerebellar vermis Normal amniotic fluid volume  Estimated fetal weight at the 14th %tile on 5/2 UA dopplers: Elevated UA Doppler studies (> 97.5% for gestational age).  One tracing showing absent end-diastolic flow, but no reversed flow BPP 8/8  Recommendations: Daily UA Dopplers and BPPs. Recommend ultrasound for growth on 5/16  Would move toward delivery for: persisently reversed flow on UA Dopplers, BPP 4/8 or non-reassuring fetal tracing, poor interval fetal growth If testing otherwise remains stable, would move for delivery at 34 weeks.  Alpha Gula, MD

## 2013-03-28 ENCOUNTER — Inpatient Hospital Stay (HOSPITAL_COMMUNITY): Payer: BC Managed Care – PPO

## 2013-03-28 LAB — GLUCOSE, CAPILLARY
Glucose-Capillary: 129 mg/dL — ABNORMAL HIGH (ref 70–99)
Glucose-Capillary: 85 mg/dL (ref 70–99)
Glucose-Capillary: 87 mg/dL (ref 70–99)

## 2013-03-28 MED ORDER — INSULIN ASPART 100 UNIT/ML ~~LOC~~ SOLN
2.0000 [IU] | Freq: Every day | SUBCUTANEOUS | Status: DC
  Administered 2013-03-28: 2 [IU] via SUBCUTANEOUS

## 2013-03-28 MED ORDER — INSULIN ASPART 100 UNIT/ML ~~LOC~~ SOLN: 4.0000 [IU] | Freq: Every day | SUBCUTANEOUS | Status: DC

## 2013-03-28 NOTE — Progress Notes (Signed)
Pt remains in ultrasound.

## 2013-03-28 NOTE — Evaluation (Signed)
Physical Therapy Evaluation Patient Details Name: [name] MRN: [id number] DOB: 09-03-1986 Today's Date: 03/28/2013 Time: 0910-0930 PT Time Calculation (min): 20 min  PT Assessment / Plan / Recommendation Clinical Impression  Patient admitted for bedrest due to IUGR. Patient very cheerful and with no concerns.  Educated patient on bedrest exercises and positioning.  Patient voiced and demonstrated understanding.  No further PT needs identified.    PT Assessment  Patent does not need any further PT services    Follow Up Recommendations  No PT follow up          Equipment Recommendations  None recommended by PT          Precautions / Restrictions Precautions Precaution Comments: bedrest with BRP   Pertinent Vitals/Pain Denies pain      Mobility  Bed Mobility Bed Mobility: Supine to Sit Supine to Sit: 7: Independent Details for Bed Mobility Assistance: encouraged patient to roll to sidelying and then come to sitting to avoid undue strain on abdomen    Exercises Antenatal Exercises Ankle Circles/Pumps: AROM;10 reps;Both;Supine Quad Sets: AROM;Both;10 reps;Supine Short Arc Quad: AROM;Both;10 reps;Supine Hip ABduction/ADduction: AROM;Both;10 reps;Supine     Visit Information  Last PT Received On: 03/28/13 Assistance Needed: +1    Subjective Data  Subjective: Patient with no concerns or complaints      Cognition  Cognition Arousal/Alertness: Awake/alert Behavior During Therapy: WFL for tasks assessed/performed Overall Cognitive Status: Within Functional Limits for tasks assessed    Extremity/Trunk Assessment Right Upper Extremity Assessment RUE ROM/Strength/Tone: [facility] for tasks assessed Left Upper Extremity Assessment LUE ROM/Strength/Tone: [facility] for tasks assessed Right Lower Extremity Assessment RLE ROM/Strength/Tone: [facility] for tasks assessed Left Lower Extremity Assessment LLE ROM/Strength/Tone: WFL for tasks assessed Trunk Assessment Trunk  Assessment: Normal   Balance Balance Balance Assessed: No  End of Session PT - End of Session Activity Tolerance: Patient tolerated treatment well Patient left: in bed;with call bell/phone within reach Nurse Communication: Other (comment) (updated RN on PT evaluation)  GP     Olivia Canter, PT [phone] 03/28/2013, 9:35 AM

## 2013-03-28 NOTE — Progress Notes (Signed)
[name] is a 27 y.o. G1P0 at [redacted]w[redacted]d  admitted for IUGR, absent diastolic flow.   HD #21  Subjective: Pt without complaints.  Active fetus.   Getting hungry in early morning. BPP 6/8 per pt.  Objective: AFVSS    Gen:  NAD, A&O Abdomen:  Nontender Ext.  TED hose . FHT:  130s, +LTV, more accels. Occ variable decel.  Reassuring   UC:   none  MFM ultrasound: Pending   CBGs PP lunch 112, Dinner 123, Today- Fasting 83, 2 pp lunch 86  Labs: Lab Results  Component Value Date   WBC 8.0 03/08/2013   HGB 10.4* 03/08/2013   HCT 32.0* 03/08/2013   MCV 77.7* 03/08/2013   PLT 285 03/08/2013     Assessment / Plan:  IUP at 32 0/7weeks 1.  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 H/o absent end diastolic flow. 2. Short Cervix (2.2 cm on 03/16/13).  Cervical length done today.  Report pending. 3.  GDM- Mildly elevated CBG.  Less than 1 week on higher carb diet.  Pt with limited mobility so she does not have many opportunities to decrease blood sugar with activity. 4.  Morbid obesity on bedrest.   1. Continue intermittent fetal monitoring, NST x 1 hour every 8 hours. Deliver for persistent reverse diastolic flow, fetal distress.  F/u today's ultrasound.  Growth ultrasound this week.   Per MFM recommendations, deliver at 34 weeks. 2. Continue bedrest.  3.  CBG QID. Log assembled of CBGs.  Dinner consistently elevated.  Recommend Novalog AC dinner.  Encouraged 3 meals/2 snacks scheduled. 4.  TED hose to reduce risk of DVT.  Encouraged to wear during the day, may take off when sleeping at night.  Perform exercises as instructed by PT.  Fetus now cephalic. Candidate for a trial of labor if NST reassuring at time of induction.    Geryl Rankins 03/28/2013, 1:54 PM

## 2013-03-28 NOTE — Progress Notes (Signed)
To mfm for ultrasound 

## 2013-03-29 ENCOUNTER — Inpatient Hospital Stay (HOSPITAL_COMMUNITY): Payer: BC Managed Care – PPO

## 2013-03-29 LAB — GLUCOSE, CAPILLARY
Glucose-Capillary: 144 mg/dL — ABNORMAL HIGH (ref 70–99)
Glucose-Capillary: 88 mg/dL (ref 70–99)

## 2013-03-29 LAB — TYPE AND SCREEN
ABO/RH(D): O POS
Antibody Screen: NEGATIVE

## 2013-03-29 MED ORDER — INSULIN ASPART 100 UNIT/ML ~~LOC~~ SOLN
6.0000 [IU] | Freq: Every day | SUBCUTANEOUS | Status: DC
  Administered 2013-03-29 – 2013-03-30 (×2): 6 [IU] via SUBCUTANEOUS

## 2013-03-29 NOTE — Progress Notes (Signed)
[name]  was seen today for an ultrasound appointment.  See full report in [facility].  Impression: IUP at 31 5/7 weeks T 21 by cffDNA; FGR; AV canal heart defect, inferior hypoplasia of the cerebellar vermis Normal amniotic fluid volume  UA dopplers: Elevated UA Doppler studies (> 97.5% for gestational age).  No absent or reversed diastolic flow BPP 8/8  Recommendations: Daily UA Dopplers and BPPs Ultrasound for growth tomorrow  Would move toward delivery for: persisently reversed flow on UA Dopplers, BPP 4/8 or non-reassuring fetal tracing, poor interval fetal growth If testing otherwise remains stable, would move for delivery at 34 weeks.  Alpha Gula, MD

## 2013-03-29 NOTE — Progress Notes (Signed)
Tabbitha Janvrin is a 27 y.o. G1P0 at [redacted]w[redacted]d  admitted for IUGR, absent diastolic flow.   HD #22  Subjective: Pt without complaints.  Active fetus.  Denies contractions or LOF.  Less hunger overnight.  Objective: AFVSS    Gen:  NAD, A&O.  S;eeping quietly. Abdomen:  Nontender Ext.  TED hose off . Trace edema. No calf tenderness FHT:  130s, +LTV, accels.  Reassuring.   UC:   none  MFM ultrasound: Pending   CBGs PP lunch 112, Dinner 123, Today- Fasting 83, 2 pp lunch 86  Labs: Lab Results  Component Value Date   WBC 8.0 03/08/2013   HGB 10.4* 03/08/2013   HCT 32.0* 03/08/2013   MCV 77.7* 03/08/2013   PLT 285 03/08/2013     Assessment / Plan:  IUP at 32 1/7weeks 1.  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 H/o absent end diastolic flow. 2. Short Cervix (2.5 cm on 03/16/13).  Improved. 3.  GDM- Mildly elevated CBG. Pt with limited mobility so she does not have many opportunities to decrease blood sugar with activity. 4.  Morbid obesity on bedrest.   1. Continue intermittent fetal monitoring, NST x 1 hour every 8 hours. Deliver for persistent reverse diastolic flow, fetal distress.  F/u today's ultrasound.  Growth ultrasound tomorrow.   Per MFM recommendations, deliver at 34 weeks. 2. Continue bedrest.  3.  CBG QID. Log assembled of CBGs.  Dinner consistently elevated.  Novalog AC dinner-increase to 6 units.  Encouraged 3 meals/2 snacks scheduled. 4.  TED hose to reduce risk of DVT.  Encouraged to wear during the day, may take off when sleeping at night.  Perform exercises as instructed by PT.  Discuss with MFM to increase mobility.  Fetus now cephalic. Candidate for a trial of labor if NST reassuring at time of induction.    Geryl Rankins 03/29/2013, 9:23 AM

## 2013-03-29 NOTE — Progress Notes (Signed)
Ur chart review completed.  

## 2013-03-30 ENCOUNTER — Inpatient Hospital Stay (HOSPITAL_COMMUNITY): Payer: BC Managed Care – PPO

## 2013-03-30 LAB — GLUCOSE, CAPILLARY
Glucose-Capillary: 86 mg/dL (ref 70–99)
Glucose-Capillary: 83 mg/dL (ref 70–99)
Glucose-Capillary: 108 mg/dL — ABNORMAL HIGH (ref 70–99)

## 2013-03-30 NOTE — Progress Notes (Signed)
[name] is a 27 y.o. G1P0 at [redacted]w[redacted]d admitted for IUGR, absent diastolic flow.  HD #22  Subjective:  Pt without complaints. Active fetus. Denies contractions or LOF Objective:  AFVSS   Gen: NAD, A&O. S;eeping quietly.  Abdomen: Nontender  Ext. No edema  FHT: 130s,  Moderate variability  accels. Reassuring.  UC: none  MFM ultrasound: Pending  CBGs PP lunch 112, Dinner 123, Today- Fasting 83, 2 pp lunch 86  Labs:  Lab Results   Component  Value  Date    WBC  8.0  03/08/2013    HGB  10.4*  03/08/2013    HCT  32.0*  03/08/2013    MCV  77.7*  03/08/2013    PLT  285  03/08/2013    Assessment / Plan:  IUP at 32 2/7weeks  1. 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 H/o absent end diastolic flow.  2. Short Cervix (2.5 cm on 03/16/13). Improved.  Patient allowed to ambulate for 20 -30 minutes after dinner  3. GDM- Mildly elevated CBG. .  1. Continue intermittent fetal monitoring, NST x 1 hour every 8 hours. Deliver for persistent reverse diastolic flow, fetal distress. F/u today's ultrasound. Growth ultrasound today   Per MFM recommendations, deliver at 34 weeks.  2. Cervical length improved.. Pt allowed to ambulate 20-30 limtes after dinner  3. CBG QID. Log assembled of CBGs. Dinner consistently elevated. Novalog AC dinner-increase to 6 units. Encouraged 3 meals/2 snacks scheduled.  Fetus now cephalic. Candidate for a trial of labor if NST reassuring at time of induction.

## 2013-03-30 NOTE — Progress Notes (Signed)
[name]  was seen today for an ultrasound appointment.  See full report in [facility].  Impression: IUP at 32 2/7 weeks T 21 by cffDNA; FGR; AV canal heart defect, inferior hypoplasia of the cerebellar vermis Estimated fetal weight today < 10th %tile (181 g over the last 2 weeks) Normal amniotic fluid volume  UA dopplers: Elevated UA Doppler studies (> 97.5% for gestational age).  No absent or reversed diastolic flow BPP 8/8  Recommendations: Recommend continued observation, daily BPP over weekend with repeat BPP and Dopplers on Monday. If stable / otherwise reassuring, would discharge home.  The patient has agreed to 3x weekly BPPs and UA Dopplers with MFM.  Would move toward delivery for: persisently reversed flow on UA Dopplers, BPP 4/8 or non-reassuring fetal tracing, poor interval fetal growth If testing otherwise remains stable, would move for delivery at 34 weeks.  Plan was discussed with Dr. Richardson Dopp.  Alpha Gula, MD

## 2013-03-31 ENCOUNTER — Ambulatory Visit (HOSPITAL_COMMUNITY): Payer: BC Managed Care – PPO

## 2013-03-31 LAB — GLUCOSE, CAPILLARY: Glucose-Capillary: 84 mg/dL (ref 70–99)

## 2013-03-31 MED ORDER — INSULIN ASPART 100 UNIT/ML ~~LOC~~ SOLN
8.0000 [IU] | Freq: Every day | SUBCUTANEOUS | Status: DC
  Administered 2013-03-31 – 2013-04-02 (×3): 8 [IU] via SUBCUTANEOUS

## 2013-03-31 NOTE — Progress Notes (Signed)
[name] is a 27 y.o. G1P0 at [redacted]w[redacted]d admitted for IUGR, absent diastolic flow.   Subjective:  Pt without complaints. Active fetus. Denies contractions or LOF  Objective:  AFVSS  Gen: NAD, A&O. S;eeping quietly.  Abdomen: Nontender  Ext. No edema  FHT: 130s, Moderate variability accels. Reassuring.  UC: none  MFM ultrasound: Pending  CBGs PP  Dinner 134, Today- Fasting 84,  Labs:  Lab Results   Component  Value  Date    WBC  8.0  03/08/2013    HGB  10.4*  03/08/2013    HCT  32.0*  03/08/2013    MCV  77.7*  03/08/2013    PLT  285  03/08/2013   Assessment / Plan:  IUP at 32 3/7weeks  1. 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 H/o absent end diastolic flow.  2. Short Cervix (2.5 cm on 03/16/13). Improved. Patient allowed to ambulate for 20 -30 minutes after dinner  3. GDM- Mildly elevated CBG.  Dinner cbg elevated novolog increased to 8 units at dinner  .  1. Continue intermittent fetal monitoring, NST x 1 hour every 8 hours.  Per Dr. Harlon Flor BPP over the weekend plan for bpp and doppler on Monday will consider outpatient management based on results... Deliver for persistent reverse diastolic flow, fetal distress. F/u today's ultrasound.Per MFM recommendations, deliver at 34 weeks.   2. Cervical length improved.. Pt allowed to ambulate 20-30 limtes after dinner   3. CBG QID. Log assembled of CBGs. Dinner consistently elevated. Novalog AC dinner-increase to 8 units.   Fetus now cephalic. Candidate for a trial of labor if NST reassuring at time of induction.

## 2013-04-01 ENCOUNTER — Ambulatory Visit (HOSPITAL_COMMUNITY): Payer: BC Managed Care – PPO

## 2013-04-01 LAB — TYPE AND SCREEN
ABO/RH(D): O POS
Antibody Screen: NEGATIVE

## 2013-04-01 LAB — GLUCOSE, CAPILLARY
Glucose-Capillary: 104 mg/dL — ABNORMAL HIGH (ref 70–99)
Glucose-Capillary: 76 mg/dL (ref 70–99)

## 2013-04-01 NOTE — Progress Notes (Signed)
[name] is a 27 y.o. G1P0 at [redacted]w[redacted]d admitted for IUGR, absent diastolic flow.  Subjective:  Pt without complaints. Active fetus. Denies contractions or LOF  Objective:  AFVSS  Gen: NAD, A&O. S;eeping quietly.  Abdomen: Nontender  Ext. No edema  FHT: 130s, Moderate variability accels. Reassuring.  UC: none  MFM ultrasound: Pending  CBGs PP Dinner 134, Today- Fasting 84,  Labs:  Lab Results   Component  Value  Date    WBC  8.0  03/08/2013    HGB  10.4*  03/08/2013    HCT  32.0*  03/08/2013    MCV  77.7*  03/08/2013    PLT  285  03/08/2013    Ultrasound today BPP 8/8  cephalic presentation.  Reactive NST   Assessment / Plan:  IUP at 32 47weeks  1. 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 H/o absent end diastolic flow.  2. Short Cervix (2.5 cm on 03/16/13). Improved. Patient allowed to ambulate for 20 -30 minutes after dinner  3. GDM- Mildly elevated CBG. Dinner cbg elevated novolog increased to 8 units at dinner  .  1. Continue intermittent fetal monitoring, NST x 1 hour every 8 hours. Per Dr. Harlon Flor BPP over the weekend plan for bpp and doppler on Monday will consider outpatient management based on results... Deliver for persistent reverse diastolic flow, fetal distress. .Per MFM recommendations, deliver at 34 weeks.  2. Cervical length improved.. Pt allowed to ambulate 20-30 limtes after dinner  3. CBG QID. Log assembled of CBGs. Dinner  cbg improved with -increase to 8 units.   Fetus now cephalic. Candidate for a trial of labor if NST reassuring at time of induction.

## 2013-04-02 ENCOUNTER — Inpatient Hospital Stay (HOSPITAL_COMMUNITY)
Admission: RE | Admit: 2013-04-02 | Discharge: 2013-04-02 | Disposition: A | Discharge status: Home / Self Care | Payer: BC Managed Care – PPO | Source: Ambulatory Visit | Attending: Obstetrics and Gynecology | Admitting: Obstetrics and Gynecology

## 2013-04-02 LAB — GLUCOSE, CAPILLARY: Glucose-Capillary: 64 mg/dL — ABNORMAL LOW (ref 70–99)

## 2013-04-02 MED ORDER — LACTATED RINGERS IV BOLUS (SEPSIS)
1000.0000 mL | Freq: Once | INTRAVENOUS | Status: AC
  Administered 2013-04-02: 1000 mL via INTRAVENOUS

## 2013-04-02 NOTE — Progress Notes (Signed)
[name] is a 27 y.o. G1P0 at [redacted]w[redacted]d  admitted for IUGR, absent diastolic flow.   HD #26  Subjective: Pt with variable decels to 70s x 1-2 minutes, ~4 episodes.  Pt received IVF bolus and they resolved.     Pt without complaints.  Active fetus.  Denies contractions or LOF.  Denies symptoms of hypoglycemia.    Objective: AFVSS    Gen:  NAD, A&O.  S;eeping quietly. Abdomen:  Nontender Ext.  TED hose off . Trace edema. No calf tenderness FHT:  130s, +LTV, accels.  Reassuring. Spontaneous decel while I was at bedside to 90s, ~90 sec.  Good recovery.  NST from last night reviewed and agree with previous interpretation. UC:   none  MFM ultrasound, 5/19:  BPP 8/8.  No absent or reverse flow.  Cephalic.   CBGs: Dinner 120, Today- Fasting 63, 2 pp lunch 117  Labs: Lab Results  Component Value Date   WBC 8.0 03/08/2013   HGB 10.4* 03/08/2013   HCT 32.0* 03/08/2013   MCV 77.7* 03/08/2013   PLT 285 03/08/2013     Assessment / Plan:  IUP at 32 5/7weeks 1.  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 H/o absent end diastolic flow. 2.  Fetal decelerations, spontaneous.  Resolved. 2. Short Cervix (2.5 cm on 03/16/13).  Improved. 3.  GDM- 2 hr PP dinner only elevation.  Responding well to 8 units Novalog ac meals but hypo glycemic this am but asymptomatic. 4.  Morbid obesity on modified bedrest, ambulate in evening after dinner.   1.  NST x 2 hour every 6 hours. Deliver for persistent reverse diastolic flow, fetal distress. Dopplers, BPP in am.  If reassuring, discharge home.  Per MFM recommendations, deliver at 34 weeks. 2. Continue bedrest.  3.  CBG QID. Novalog AC dinner-decrease to 6 units if am CBG in low 60s.  Encouraged 3 meals/2 snacks scheduled. 4.  TED hose to reduce risk of DVT.  Encouraged to wear during the day, may take off when sleeping at night.  Perform exercises as instructed by PT.  Ok to increase mobility per Dr.  Claudean Severance.  Fetus now cephalic. Candidate for a trial of labor if NST reassuring at time of induction.    [name] 04/02/2013, 6:28 PM

## 2013-04-02 NOTE — Progress Notes (Signed)
Ur chart review completed.  

## 2013-04-02 NOTE — Progress Notes (Signed)
Discussed with MD the MFM report.  No order changes at this time.  MD will review FHR strip and determine if pt to go back on the monitor.

## 2013-04-02 NOTE — Progress Notes (Signed)
Dr Richardson Dopp called, pt having variable and prolong decels about 25 mins apart down to 70 bpm, with minimum to moderate variability. Dr Richardson Dopp acknowledged. Order for 1 liter bolus and IV received and to keep pt on the monitor for now.

## 2013-04-02 NOTE — Progress Notes (Signed)
[name]  was seen today for an ultrasound appointment.  See full report in [facility].  Impression: IUP at 32 5/7 weeks T 21 by cffDNA; FGR; AV canal heart defect, inferior hypoplasia of the cerebellar vermis Normal amniotic fluid volume  UA dopplers: Elevated UA Doppler studies (> 97.5% for gestational age).  No absent or reversed diastolic flow BPP 8/8  Reviewed fetal tracing from last night - appeared to have at least 3 spontaneous decelerations for approximately 1 minute in duration; fetal strip was otherwise reactive/ reassuring.  Recommendations: Given fetal strip concerns - recommend continued monitoring - would try to do continuous monitoring or at least 3 fetal strips x 1-2 hrs If fetal tracing is reassuring over next 24 hours, would consider hospital discharge with 3x weekly BPPs/ Doppler studies with MFM  Daily UA Dopplers and BPPs Ultrasound for growth tomorrow  Would move toward delivery for: persisently reversed flow on UA Dopplers, BPP 4/8 or non-reassuring fetal tracing If testing otherwise remains stable, would move for delivery at 34 weeks.  Alpha Gula, MD

## 2013-04-03 ENCOUNTER — Inpatient Hospital Stay (HOSPITAL_COMMUNITY): Payer: BC Managed Care – PPO

## 2013-04-03 ENCOUNTER — Encounter (HOSPITAL_COMMUNITY): Payer: Self-pay | Admitting: *Deleted

## 2013-04-03 ENCOUNTER — Encounter (HOSPITAL_COMMUNITY)
Admission: AD | Disposition: A | Discharge status: Home / Self Care | Payer: Self-pay | Source: Ambulatory Visit | Attending: Obstetrics and Gynecology

## 2013-04-03 ENCOUNTER — Encounter (HOSPITAL_COMMUNITY): Payer: Self-pay | Admitting: Anesthesiology

## 2013-04-03 ENCOUNTER — Inpatient Hospital Stay (HOSPITAL_COMMUNITY): Payer: BC Managed Care – PPO | Admitting: Anesthesiology

## 2013-04-03 DIAGNOSIS — O3430 Maternal care for cervical incompetence, unspecified trimester: Secondary | ICD-10-CM

## 2013-04-03 DIAGNOSIS — O411090 Infection of amniotic sac and membranes, unspecified, unspecified trimester, not applicable or unspecified: Secondary | ICD-10-CM

## 2013-04-03 LAB — CBC
HCT: 34.2 % — ABNORMAL LOW (ref 36.0–46.0)
Hemoglobin: 11 g/dL — ABNORMAL LOW (ref 12.0–15.0)
MCV: 77.6 fL — ABNORMAL LOW (ref 78.0–100.0)
RDW: 15.5 % (ref 11.5–15.5)
WBC: 8.2 10*3/uL (ref 4.0–10.5)

## 2013-04-03 LAB — URINE MICROSCOPIC-ADD ON

## 2013-04-03 LAB — URINALYSIS, ROUTINE W REFLEX MICROSCOPIC
Bilirubin Urine: NEGATIVE
Glucose, UA: NEGATIVE mg/dL
Hgb urine dipstick: NEGATIVE
Specific Gravity, Urine: 1.01 (ref 1.005–1.030)

## 2013-04-03 LAB — GLUCOSE, CAPILLARY
Glucose-Capillary: 130 mg/dL — ABNORMAL HIGH (ref 70–99)
Glucose-Capillary: 76 mg/dL (ref 70–99)
Glucose-Capillary: 82 mg/dL (ref 70–99)

## 2013-04-03 LAB — RPR: RPR Ser Ql: NONREACTIVE

## 2013-04-03 SURGERY — Surgical Case
Anesthesia: Spinal | Site: Abdomen | Wound class: Clean Contaminated

## 2013-04-03 MED ORDER — ONDANSETRON HCL 4 MG/2ML IJ SOLN: 4.0000 mg | Freq: Three times a day (TID) | INTRAMUSCULAR | Status: DC | PRN

## 2013-04-03 MED ORDER — PHENYLEPHRINE HCL 10 MG/ML IJ SOLN
INTRAMUSCULAR | Status: DC | PRN
  Administered 2013-04-03 (×5): 80 ug via INTRAVENOUS

## 2013-04-03 MED ORDER — LACTATED RINGERS IV SOLN
INTRAVENOUS | Status: DC | PRN
  Administered 2013-04-03 (×2): via INTRAVENOUS

## 2013-04-03 MED ORDER — EPHEDRINE SULFATE 50 MG/ML IJ SOLN
INTRAMUSCULAR | Status: DC | PRN
  Administered 2013-04-03: 10 mg via INTRAVENOUS

## 2013-04-03 MED ORDER — ONDANSETRON HCL 4 MG/2ML IJ SOLN: 4.0000 mg | Freq: Four times a day (QID) | INTRAMUSCULAR | Status: DC | PRN

## 2013-04-03 MED ORDER — DIPHENHYDRAMINE HCL 25 MG PO CAPS
25.0000 mg | ORAL_CAPSULE | ORAL | Status: DC | PRN
  Filled 2013-04-03: qty 1

## 2013-04-03 MED ORDER — SODIUM CHLORIDE 0.9 % IJ SOLN: 3.0000 mL | INTRAMUSCULAR | Status: DC | PRN

## 2013-04-03 MED ORDER — FENTANYL CITRATE 0.05 MG/ML IJ SOLN
INTRAMUSCULAR | Status: DC | PRN
  Administered 2013-04-03: 25 ug via INTRAVENOUS

## 2013-04-03 MED ORDER — ONDANSETRON HCL 4 MG/2ML IJ SOLN
INTRAMUSCULAR | Status: DC | PRN
  Administered 2013-04-03: 4 mg via INTRAVENOUS

## 2013-04-03 MED ORDER — NALBUPHINE SYRINGE 5 MG/0.5 ML
5.0000 mg | INJECTION | INTRAMUSCULAR | Status: DC | PRN
  Filled 2013-04-03: qty 1

## 2013-04-03 MED ORDER — PENICILLIN G POTASSIUM 5000000 UNITS IJ SOLR: 5.0000 10*6.[IU] | Freq: Once | INTRAVENOUS | Status: DC

## 2013-04-03 MED ORDER — ACETAMINOPHEN 10 MG/ML IV SOLN
1000.0000 mg | Freq: Four times a day (QID) | INTRAVENOUS | Status: DC | PRN
  Filled 2013-04-03: qty 100

## 2013-04-03 MED ORDER — PENICILLIN G POTASSIUM 5000000 UNITS IJ SOLR: 2.5000 10*6.[IU] | INTRAVENOUS | Status: DC

## 2013-04-03 MED ORDER — MORPHINE SULFATE (PF) 0.5 MG/ML IJ SOLN
INTRAMUSCULAR | Status: DC | PRN
  Administered 2013-04-03: .15 mg via EPIDURAL

## 2013-04-03 MED ORDER — OXYTOCIN 40 UNITS IN LACTATED RINGERS INFUSION - SIMPLE MED: 62.5000 mL/h | INTRAVENOUS | Status: DC

## 2013-04-03 MED ORDER — ACETAMINOPHEN 325 MG PO TABS: 650.0000 mg | ORAL_TABLET | ORAL | Status: DC | PRN

## 2013-04-03 MED ORDER — METOCLOPRAMIDE HCL 5 MG/ML IJ SOLN: 10.0000 mg | Freq: Three times a day (TID) | INTRAMUSCULAR | Status: DC | PRN

## 2013-04-03 MED ORDER — KETOROLAC TROMETHAMINE 60 MG/2ML IM SOLN: 60.0000 mg | Freq: Once | INTRAMUSCULAR | Status: AC | PRN

## 2013-04-03 MED ORDER — LIDOCAINE HCL (PF) 1 % IJ SOLN
INTRAMUSCULAR | Status: AC
  Filled 2013-04-03: qty 5

## 2013-04-03 MED ORDER — CITRIC ACID-SODIUM CITRATE 334-500 MG/5ML PO SOLN
30.0000 mL | ORAL | Status: DC | PRN
  Administered 2013-04-03: 30 mL via ORAL
  Filled 2013-04-03: qty 15

## 2013-04-03 MED ORDER — SCOPOLAMINE 1 MG/3DAYS TD PT72
MEDICATED_PATCH | TRANSDERMAL | Status: AC
  Filled 2013-04-03: qty 1

## 2013-04-03 MED ORDER — LACTATED RINGERS IV SOLN
INTRAVENOUS | Status: DC | PRN
  Administered 2013-04-03 (×2): via INTRAVENOUS

## 2013-04-03 MED ORDER — FENTANYL CITRATE 0.05 MG/ML IJ SOLN
INTRAMUSCULAR | Status: AC
  Filled 2013-04-03: qty 2

## 2013-04-03 MED ORDER — SCOPOLAMINE 1 MG/3DAYS TD PT72
1.0000 | MEDICATED_PATCH | Freq: Once | TRANSDERMAL | Status: DC
  Administered 2013-04-03: 1.5 mg via TRANSDERMAL

## 2013-04-03 MED ORDER — ATROPINE SULFATE 0.1 MG/ML IJ SOLN
INTRAMUSCULAR | Status: AC
  Filled 2013-04-03: qty 10

## 2013-04-03 MED ORDER — TERBUTALINE SULFATE 1 MG/ML IJ SOLN: 0.2500 mg | Freq: Once | INTRAMUSCULAR | Status: AC | PRN

## 2013-04-03 MED ORDER — ATROPINE SULFATE 0.4 MG/ML IJ SOLN
INTRAMUSCULAR | Status: DC | PRN
  Administered 2013-04-03: .1 mg via INTRAVENOUS

## 2013-04-03 MED ORDER — CLINDAMYCIN PHOSPHATE 900 MG/50ML IV SOLN
900.0000 mg | Freq: Three times a day (TID) | INTRAVENOUS | Status: DC
  Administered 2013-04-03 (×2): 900 mg via INTRAVENOUS
  Filled 2013-04-03 (×2): qty 50

## 2013-04-03 MED ORDER — NALOXONE HCL 0.4 MG/ML IJ SOLN: 0.4000 mg | INTRAMUSCULAR | Status: DC | PRN

## 2013-04-03 MED ORDER — DIPHENHYDRAMINE HCL 50 MG/ML IJ SOLN: 25.0000 mg | INTRAMUSCULAR | Status: DC | PRN

## 2013-04-03 MED ORDER — OXYTOCIN 10 UNIT/ML IJ SOLN
40.0000 [IU] | INTRAVENOUS | Status: DC | PRN
  Administered 2013-04-03: 40 [IU] via INTRAVENOUS

## 2013-04-03 MED ORDER — FENTANYL CITRATE 0.05 MG/ML IJ SOLN: 25.0000 ug | INTRAMUSCULAR | Status: DC | PRN

## 2013-04-03 MED ORDER — OXYTOCIN 40 UNITS IN LACTATED RINGERS INFUSION - SIMPLE MED
1.0000 m[IU]/min | INTRAVENOUS | Status: DC
  Administered 2013-04-03: 2 m[IU]/min via INTRAVENOUS
  Filled 2013-04-03: qty 1000

## 2013-04-03 MED ORDER — OXYTOCIN 10 UNIT/ML IJ SOLN
INTRAMUSCULAR | Status: AC
  Filled 2013-04-03: qty 4

## 2013-04-03 MED ORDER — ATROPINE SULFATE 1 MG/ML IJ SOLN
INTRAMUSCULAR | Status: DC | PRN
  Administered 2013-04-03: .1 mg via INTRAVENOUS

## 2013-04-03 MED ORDER — METRONIDAZOLE IN NACL 5-0.79 MG/ML-% IV SOLN
500.0000 mg | INTRAVENOUS | Status: DC
  Filled 2013-04-03: qty 100

## 2013-04-03 MED ORDER — DEXTROSE 5 % IV SOLN
460.0000 mg | INTRAVENOUS | Status: AC
  Administered 2013-04-03: 460 mg via INTRAVENOUS
  Filled 2013-04-03: qty 11.5

## 2013-04-03 MED ORDER — KETOROLAC TROMETHAMINE 30 MG/ML IJ SOLN: 30.0000 mg | Freq: Four times a day (QID) | INTRAMUSCULAR | Status: DC | PRN

## 2013-04-03 MED ORDER — LIDOCAINE HCL (PF) 1 % IJ SOLN: 30.0000 mL | INTRAMUSCULAR | Status: DC | PRN

## 2013-04-03 MED ORDER — IBUPROFEN 600 MG PO TABS: 600.0000 mg | ORAL_TABLET | Freq: Four times a day (QID) | ORAL | Status: DC | PRN

## 2013-04-03 MED ORDER — DIPHENHYDRAMINE HCL 50 MG/ML IJ SOLN: 12.5000 mg | INTRAMUSCULAR | Status: DC | PRN

## 2013-04-03 MED ORDER — ONDANSETRON HCL 4 MG/2ML IJ SOLN
INTRAMUSCULAR | Status: AC
  Filled 2013-04-03: qty 2

## 2013-04-03 MED ORDER — KETOROLAC TROMETHAMINE 60 MG/2ML IM SOLN
INTRAMUSCULAR | Status: AC
  Administered 2013-04-03: 60 mg via INTRAMUSCULAR
  Filled 2013-04-03: qty 2

## 2013-04-03 MED ORDER — METRONIDAZOLE IN NACL 5-0.79 MG/ML-% IV SOLN
INTRAVENOUS | Status: DC | PRN
  Administered 2013-04-03: 500 mg via INTRAVENOUS

## 2013-04-03 MED ORDER — DEXTROSE 5 % IV SOLN
1.0000 ug/kg/h | INTRAVENOUS | Status: DC | PRN
  Filled 2013-04-03: qty 2

## 2013-04-03 MED ORDER — MEPERIDINE HCL 25 MG/ML IJ SOLN: 6.2500 mg | INTRAMUSCULAR | Status: DC | PRN

## 2013-04-03 MED ORDER — MORPHINE SULFATE 0.5 MG/ML IJ SOLN
INTRAMUSCULAR | Status: AC
  Filled 2013-04-03: qty 10

## 2013-04-03 MED ORDER — LACTATED RINGERS IV SOLN
INTRAVENOUS | Status: DC
  Administered 2013-04-03: 15:00:00 via INTRAVENOUS

## 2013-04-03 MED ORDER — OXYCODONE-ACETAMINOPHEN 5-325 MG PO TABS: 1.0000 | ORAL_TABLET | ORAL | Status: DC | PRN

## 2013-04-03 MED ORDER — LACTATED RINGERS IV SOLN: 500.0000 mL | INTRAVENOUS | Status: DC | PRN

## 2013-04-03 MED ORDER — OXYTOCIN BOLUS FROM INFUSION: 500.0000 mL | INTRAVENOUS | Status: DC

## 2013-04-03 SURGICAL SUPPLY — 34 items
BARRIER ADHS 3X4 INTERCEED (GAUZE/BANDAGES/DRESSINGS) ×2 IMPLANT
BENZOIN TINCTURE PRP APPL 2/3 (GAUZE/BANDAGES/DRESSINGS) ×6 IMPLANT
CLOTH BEACON ORANGE TIMEOUT ST (SAFETY) ×2 IMPLANT
DERMABOND ADVANCED (GAUZE/BANDAGES/DRESSINGS)
DERMABOND ADVANCED .7 DNX12 (GAUZE/BANDAGES/DRESSINGS) IMPLANT
DRAPE LG THREE QUARTER DISP (DRAPES) ×2 IMPLANT
DRSG OPSITE POSTOP 4X10 (GAUZE/BANDAGES/DRESSINGS) ×2 IMPLANT
DURAPREP 26ML APPLICATOR (WOUND CARE) ×2 IMPLANT
ELECT REM PT RETURN 9FT ADLT (ELECTROSURGICAL) ×2
ELECTRODE REM PT RTRN 9FT ADLT (ELECTROSURGICAL) ×1 IMPLANT
EXTRACTOR VACUUM BELL STYLE (SUCTIONS) IMPLANT
GLOVE BIO SURGEON STRL SZ7 (GLOVE) ×2 IMPLANT
GLOVE BIOGEL PI IND STRL 7.0 (GLOVE) ×1 IMPLANT
GLOVE BIOGEL PI INDICATOR 7.0 (GLOVE) ×1
GOWN PREVENTION PLUS XLARGE (GOWN DISPOSABLE) ×4 IMPLANT
GOWN STRL REIN XL XLG (GOWN DISPOSABLE) ×4 IMPLANT
KIT ABG SYR 3ML LUER SLIP (SYRINGE) ×2 IMPLANT
NEEDLE HYPO 25X5/8 SAFETYGLIDE (NEEDLE) ×2 IMPLANT
NS IRRIG 1000ML POUR BTL (IV SOLUTION) ×2 IMPLANT
PACK C SECTION WH (CUSTOM PROCEDURE TRAY) ×2 IMPLANT
PAD ABD 7.5X8 STRL (GAUZE/BANDAGES/DRESSINGS) ×2 IMPLANT
PAD OB MATERNITY 4.3X12.25 (PERSONAL CARE ITEMS) ×2 IMPLANT
RTRCTR C-SECT PINK 25CM LRG (MISCELLANEOUS) ×2 IMPLANT
STRIP CLOSURE SKIN 1/2X4 (GAUZE/BANDAGES/DRESSINGS) ×2 IMPLANT
SUT CHROMIC 0 CTX 36 (SUTURE) ×10 IMPLANT
SUT PLAIN 2 0 (SUTURE)
SUT PLAIN 2 0 XLH (SUTURE) ×2 IMPLANT
SUT PLAIN ABS 2-0 54XMFL TIE (SUTURE) IMPLANT
SUT VIC AB 0 CT1 27 (SUTURE) ×3
SUT VIC AB 0 CT1 27XBRD ANBCTR (SUTURE) ×3 IMPLANT
SUT VIC AB 4-0 KS 27 (SUTURE) ×2 IMPLANT
TOWEL OR 17X24 6PK STRL BLUE (TOWEL DISPOSABLE) ×6 IMPLANT
TRAY FOLEY CATH 14FR (SET/KITS/TRAYS/PACK) ×2 IMPLANT
WATER STERILE IRR 1000ML POUR (IV SOLUTION) ×2 IMPLANT

## 2013-04-03 NOTE — Progress Notes (Signed)
To mfm for ultrasound 

## 2013-04-03 NOTE — Progress Notes (Signed)
04/03/13 1400  Clinical Encounter Type  Visited With Patient and family together (Significant other Javonne)  Visit Type Spiritual support;Social support  Spiritual Encounters  Spiritual Needs Emotional;Prayer   Followed up with Ms Adah Salvage and FOB Ranee Gosselin, offering pastoral support, prayer, affirmation, and encouragement as they processed the "emotional whiplash" of the change in plan from going home to delivering.  Family is happy, slightly nervous, cheerful, feeling well supported.  They have contacted extended family and look forward to next steps.  They are aware of ongoing chaplain availability. We plan for me to follow up in the morning.  [address] [place], [place] [phone]

## 2013-04-03 NOTE — Anesthesia Preprocedure Evaluation (Signed)
Anesthesia Evaluation    Airway       Dental   Pulmonary sleep apnea ,          Cardiovascular     Neuro/Psych    GI/Hepatic   Endo/Other  diabetes, GestationalMorbid obesity  Renal/GU      Musculoskeletal   Abdominal   Peds  Hematology   Anesthesia Other Findings   Reproductive/Obstetrics                           Anesthesia Physical Anesthesia Plan  ASA: III  Anesthesia Plan: Spinal   Post-op Pain Management:    Induction:   Airway Management Planned:   Additional Equipment:   Intra-op Plan:   Post-operative Plan:   Informed Consent: I have reviewed the patients History and Physical, chart, labs and discussed the procedure including the risks, benefits and alternatives for the proposed anesthesia with the patient or authorized representative who has indicated his/her understanding and acceptance.   Dental Advisory Given  Plan Discussed with: CRNA  Anesthesia Plan Comments: (Lab work confirmed with CRNA in room. Platelets okay. Discussed spinal anesthetic, and patient consents to the procedure:  included risk of possible headache,backache, failed block, allergic reaction, and nerve injury. This patient was asked if she had any questions or concerns before the procedure started. )        Anesthesia Quick Evaluation

## 2013-04-03 NOTE — Brief Op Note (Signed)
03/08/2013 - 04/03/2013  9:32 PM  PATIENT:  [name]  27 y.o. female  PRE-OPERATIVE DIAGNOSIS:  IUP at 32 6/7 weeks, Non-reassuring fetal testing (Positive CST), Intermittent Reverse flow, Suspected Trisomy 21, AV Canal defect, GDM on Insulin, Morbid Obesity.   POST-OPERATIVE DIAGNOSIS:  same  PROCEDURE:  Procedure(s): CESAREAN SECTION (N/A), Primary, LTCS, 2 layer closure  SURGEON:  Surgeon(s) and Role:    * Geryl Rankins, MD - Primary    * Reva Bores, MD - Assisting  PHYSICIAN ASSISTANT: Dr. Shawnie Pons  ASSISTANTS: Technician   ANESTHESIA:   spinal  EBL:  Total I/O In: 2400 [I.V.:2400] Out: 400 [Urine:100; Blood:300]  BLOOD ADMINISTERED:none  DRAINS: Urinary Catheter (Foley)   LOCAL MEDICATIONS USED:  NONE  SPECIMEN:  Placenta  DISPOSITION OF SPECIMEN:  PATHOLOGY  COUNTS:  YES  TOURNIQUET:  * No tourniquets in log *  DICTATION: .Other Dictation: Dictation Number [id number]  PLAN OF CARE: Transfer to postpartum  PATIENT DISPOSITION:  PACU - hemodynamically stable.   Delay start of Pharmacological VTE agent (>24hrs) due to surgical blood loss or risk of bleeding: not applicable

## 2013-04-03 NOTE — Anesthesia Postprocedure Evaluation (Signed)
  Anesthesia Post Note  Patient: Therapist, occupational  Procedure(s) Performed: Procedure(s) (LRB): CESAREAN SECTION (N/A)  Anesthesia type: Spinal  Patient location: PACU  Post pain: Pain level controlled  Post assessment: Post-op Vital signs reviewed  Last Vitals:  Filed Vitals:   04/03/13 2230  BP: 138/89  Pulse: 88  Temp:   Resp: 20    Post vital signs: Reviewed  Level of consciousness: awake  Complications: No apparent anesthesia complications

## 2013-04-03 NOTE — Transfer of Care (Signed)
Immediate Anesthesia Transfer of Care Note  Patient: Therapist, occupational  Procedure(s) Performed: Procedure(s): CESAREAN SECTION (N/A)  Patient Location: PACU  Anesthesia Type:Spinal  Level of Consciousness: awake, alert  and oriented  Airway & Oxygen Therapy: Patient Spontanous Breathing  Post-op Assessment: Report given to PACU RN and Post -op Vital signs reviewed and stable  Post vital signs: Reviewed and stable  Complications: No apparent anesthesia complications

## 2013-04-03 NOTE — Progress Notes (Signed)
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  Findings: 1. Single intrauterine pregnancy. 2. Posterior placenta without evidence of previa. 3. Normal amniotic fluid volume; but appears decreased for gestational age. 5.  Umbilical artery Doppler studies have intermittent reversal of flow.  6. Normal ductus venosus Doppler studies; although visualization is suboptimal. 7. Biophysical profile is 8/8.  Recommendations: 1. Fetal growth restriction/abnormal Doppler studies: - previously counseled - s/p betamethasone - given intermittent reversal of flow, [redacted]w[redacted]d, s/p betamethasone, and recent fetal decelerations on monitor recommend consider delivery today - if desires trial of labor recommend contraction stress test prior - discussed increased risk of need for C section and fetal intolerance of labor but feel trial of labor is appropriate if passes contraction stress test - if does not proceed with delivery today recommend continuous fetal monitoring and immediate delivery for persistent reversal of umbilical artery Doppler studies, reversal of ductus venosus Doppler studies, nonreassuring fetal tracing, or [redacted] weeks gestation 2. 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 AV canal defect: - previously counseled - followed by Pediatric Cardiology 4. 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 Results given to Dr. Idamae Schuller who will discuss above recommendations with the patient and determine plan.   Eulis Foster, MD

## 2013-04-03 NOTE — Anesthesia Procedure Notes (Signed)
Spinal  Patient location during procedure: OR Preanesthetic Checklist Completed: patient identified, site marked, surgical consent, pre-op evaluation, timeout performed, IV checked, risks and benefits discussed and monitors and equipment checked Spinal Block Patient position: sitting Prep: DuraPrep Patient monitoring: heart rate, cardiac monitor, continuous pulse ox and blood pressure Approach: midline Location: L3-4 Injection technique: single-shot Needle Needle type: Sprotte  Needle gauge: 24 G Needle length: 9 cm Needle insertion depth: 9 cm Assessment Sensory level: T4 Additional Notes 1st Attempt at L4-5.....Marland KitchenMarland KitchenNo CSF Change to L3-4, achieved LOR with touhy at 9cm, sprotte thru Touhy Spinal Dosage in OR  Bupivicaine ml       1.8 PFMS04   mcg        150 Fentanyl mcg            25

## 2013-04-03 NOTE — Progress Notes (Signed)
Ultrasound results from today discussed with Dr. Vincenza Hews.  She  recommended proceeding with delivery due to intermittent reverse flow now with fetal decelerations.   Agree with recommendation to avoid delivery of a compromised fetus, which might occur if pregnancy is prolonged.  Would also like to delivery baby vaginally, which would be less likely if we wait until persistent reverse flow is present.  Given the variable nature of the patient's doppler studies, proceeding in the face of just intermittent reversed flow would be a gray area. But given the new decelerations, which are impressive, gestational age and s/p BMZ, feel comfortable recommending delivery. Discussed the above at length with the patient and she is agreeable to proceed with CST and then induction if CST reassuring.  All questions answered.

## 2013-04-03 NOTE — Progress Notes (Signed)
Patient transferred to room 163.

## 2013-04-03 NOTE — Progress Notes (Signed)
Patient ID: [name], female   DOB: Oct 30, 1986, 27 y.o.   MRN: [id number]  In to discuss management plan, ie-proceeding with primary c-section.   CST positive.  Late decelerations with 3 out of 3 contractions.  Baby with good variability now, accelerations present. Discussed R/B/A at length with pt, partner and friends at bedside.  All questions answered.  Pt also informed on possible classical cesarean section and if so need for future deliveries via cesarean section.  Also counseled on use of blood products in the event of emergency.   Spoke with Dr. Katrinka Blazing, neonatologist, to discuss pt history.  He had already reviewed her chart, including notes from cardiologist.   Clindamycin at ~3pm.  Gentamycin and Flagyl preop. Continuous monitoring until transport to OR.

## 2013-04-04 ENCOUNTER — Ambulatory Visit (HOSPITAL_COMMUNITY): Payer: BC Managed Care – PPO

## 2013-04-04 ENCOUNTER — Encounter: Payer: Self-pay | Admitting: Obstetrics and Gynecology

## 2013-04-04 ENCOUNTER — Encounter (HOSPITAL_COMMUNITY): Payer: Self-pay | Admitting: Obstetrics and Gynecology

## 2013-04-04 LAB — CBC
MCHC: 32.4 g/dL (ref 30.0–36.0)
Platelets: 231 10*3/uL (ref 150–400)
RDW: 15.5 % (ref 11.5–15.5)

## 2013-04-04 MED ORDER — PRENATAL MULTIVITAMIN CH
1.0000 | ORAL_TABLET | Freq: Every day | ORAL | Status: DC
Start: 2013-04-04 — End: 2013-04-06
  Administered 2013-04-04 – 2013-04-06 (×3): 1 via ORAL
  Filled 2013-04-04 (×4): qty 1

## 2013-04-04 MED ORDER — METHYLERGONOVINE MALEATE 0.2 MG/ML IJ SOLN: 0.2000 mg | INTRAMUSCULAR | Status: DC | PRN

## 2013-04-04 MED ORDER — LACTATED RINGERS IV SOLN
INTRAVENOUS | Status: DC
Start: 2013-04-04 — End: 2013-04-06
  Administered 2013-04-04: 05:00:00 via INTRAVENOUS

## 2013-04-04 MED ORDER — SIMETHICONE 80 MG PO CHEW
80.0000 mg | CHEWABLE_TABLET | Freq: Three times a day (TID) | ORAL | Status: DC
Start: 2013-04-04 — End: 2013-04-06
  Administered 2013-04-04 – 2013-04-06 (×10): 80 mg via ORAL

## 2013-04-04 MED ORDER — DIBUCAINE 1 % RE OINT: 1.0000 "application " | TOPICAL_OINTMENT | RECTAL | Status: DC | PRN

## 2013-04-04 MED ORDER — OXYTOCIN 40 UNITS IN LACTATED RINGERS INFUSION - SIMPLE MED: 62.5000 mL/h | INTRAVENOUS | Status: AC

## 2013-04-04 MED ORDER — WITCH HAZEL-GLYCERIN EX PADS: 1.0000 "application " | MEDICATED_PAD | CUTANEOUS | Status: DC | PRN

## 2013-04-04 MED ORDER — SODIUM CHLORIDE 0.9 % IJ SOLN: 3.0000 mL | INTRAMUSCULAR | Status: DC | PRN

## 2013-04-04 MED ORDER — SENNOSIDES-DOCUSATE SODIUM 8.6-50 MG PO TABS
2.0000 | ORAL_TABLET | Freq: Every day | ORAL | Status: DC
  Administered 2013-04-04 – 2013-04-05 (×2): 2 via ORAL

## 2013-04-04 MED ORDER — MEASLES, MUMPS & RUBELLA VAC ~~LOC~~ INJ
0.5000 mL | INJECTION | Freq: Once | SUBCUTANEOUS | Status: DC
  Filled 2013-04-04: qty 0.5

## 2013-04-04 MED ORDER — METHYLERGONOVINE MALEATE 0.2 MG PO TABS: 0.2000 mg | ORAL_TABLET | ORAL | Status: DC | PRN

## 2013-04-04 MED ORDER — LANOLIN HYDROUS EX OINT: 1.0000 "application " | TOPICAL_OINTMENT | CUTANEOUS | Status: DC | PRN

## 2013-04-04 MED ORDER — NALOXONE HCL 0.4 MG/ML IJ SOLN: 0.4000 mg | INTRAMUSCULAR | Status: DC | PRN

## 2013-04-04 MED ORDER — ZOLPIDEM TARTRATE 5 MG PO TABS: 5.0000 mg | ORAL_TABLET | Freq: Every evening | ORAL | Status: DC | PRN

## 2013-04-04 MED ORDER — SIMETHICONE 80 MG PO CHEW: 80.0000 mg | CHEWABLE_TABLET | ORAL | Status: DC | PRN

## 2013-04-04 MED ORDER — ONDANSETRON HCL 4 MG PO TABS: 4.0000 mg | ORAL_TABLET | ORAL | Status: DC | PRN

## 2013-04-04 MED ORDER — MENTHOL 3 MG MT LOZG: 1.0000 | LOZENGE | OROMUCOSAL | Status: DC | PRN

## 2013-04-04 MED ORDER — IBUPROFEN 600 MG PO TABS
600.0000 mg | ORAL_TABLET | Freq: Four times a day (QID) | ORAL | Status: DC
  Administered 2013-04-04 – 2013-04-06 (×10): 600 mg via ORAL
  Filled 2013-04-04 (×10): qty 1

## 2013-04-04 MED ORDER — TETANUS-DIPHTH-ACELL PERTUSSIS 5-2.5-18.5 LF-MCG/0.5 IM SUSP: 0.5000 mL | Freq: Once | INTRAMUSCULAR | Status: DC

## 2013-04-04 MED ORDER — OXYCODONE-ACETAMINOPHEN 5-325 MG PO TABS
1.0000 | ORAL_TABLET | ORAL | Status: DC | PRN
  Administered 2013-04-04: 1 via ORAL
  Administered 2013-04-05: 2 via ORAL
  Administered 2013-04-05 – 2013-04-06 (×7): 1 via ORAL
  Filled 2013-04-04: qty 2
  Filled 2013-04-04 (×9): qty 1

## 2013-04-04 MED ORDER — ONDANSETRON HCL 4 MG/2ML IJ SOLN: 4.0000 mg | INTRAMUSCULAR | Status: DC | PRN

## 2013-04-04 MED ORDER — DIPHENHYDRAMINE HCL 25 MG PO CAPS: 25.0000 mg | ORAL_CAPSULE | Freq: Four times a day (QID) | ORAL | Status: DC | PRN

## 2013-04-04 NOTE — Anesthesia Postprocedure Evaluation (Signed)
  Anesthesia Post-op Note  Patient: Therapist, occupational  Procedure(s) Performed: Procedure(s): CESAREAN SECTION (N/A)  Patient Location: PACU and Women's Unit  Anesthesia Type:Spinal  Level of Consciousness: awake, alert  and oriented  Airway and Oxygen Therapy: Patient Spontanous Breathing  Post-op Pain: mild  Post-op Assessment: Patient's Cardiovascular Status Stable, Respiratory Function Stable, No signs of Nausea or vomiting, Adequate PO intake and Pain level controlled  Post-op Vital Signs: stable  Complications: No apparent anesthesia complications

## 2013-04-04 NOTE — Progress Notes (Signed)
Chaplain stopped by to see Ms [name]. She was surrounded with supportive family. Chaplain stated that a chaplain is available at all hours should she need spiritual care.   Should Ms [name] not page a chaplain during the night, follow up by daytime chaplains suggested  [name]. Ziyah Cordoba, DMin, MDiv, MA Chaplain

## 2013-04-04 NOTE — Progress Notes (Signed)
Subjective: Postpartum Day 1: Cesarean Delivery Patient reports she is doing well.  Bleeding is not excessive.  Still feels like she has "sea legs" but ambulating well.  Denies SOB, chest pain, dizziness.  Pumping.   Baby is stable.    Objective: Vital signs in last 24 hours: Temp:  [97.4 F (36.3 C)-98.6 F (37 C)] 98.5 F (36.9 C) (05/21 1353) Pulse Rate:  [75-115] 101 (05/21 1353) Resp:  [12-24] 20 (05/21 1353) BP: (94-142)/(51-89) 94/63 mmHg (05/21 1353) SpO2:  [94 %-100 %] 98 % (05/21 1353) Weight:  [133.925 kg (295 lb 4 oz)] 133.925 kg (295 lb 4 oz) (05/21 0009)  Physical Exam:  General: alert, cooperative and no distress Lochia: appropriate Uterine Fundus: firm Incision: Dressing is clean and dry. DVT Evaluation: No evidence of DVT seen on physical exam. Calf/Ankle edema is present.   Recent Labs  04/03/13 0925 04/04/13 0634  HGB 11.0* 10.3*  HCT 34.2* 31.8*    Assessment/Plan: Status post Cesarean section. Doing well postoperatively.  Continue current care. Encouraged ambulation TID.  DVT precautions given. Anticipate discharge POD #3.  Geryl Rankins 04/04/2013, 2:01 PM

## 2013-04-04 NOTE — Op Note (Signed)
NAME:  LAKYNN, HALVORSEN                 ACCOUNT NO.:  MEDICAL RECORD NO.:  [id number]  LOCATION:                                 FACILITY:  PHYSICIAN:  Pieter Partridge, MD   DATE OF BIRTH:  17-Jan-1986  DATE OF PROCEDURE:  04/03/2013 DATE OF DISCHARGE:                              OPERATIVE REPORT   PREOPERATIVE DIAGNOSIS: 1. Intrauterine pregnancy at 32-6/7th weeks, nonreassuring fetal     testing (positive CST). 2. Intermittent reversed flow. 3. 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 4. AV canal defect. 5. Gestational diabetes, on insulin and morbid obesity.  POSTOPERATIVE DIAGNOSIS: 1. Intrauterine pregnancy at 32-6/7th weeks, nonreassuring fetal     testing (positive CST). 2. Intermittent reversed flow. 3. 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 4. AV canal defect. 5. Gestational diabetes, on insulin and morbid obesity.  PROCEDURE:  Primary low transverse cesarean section, 2-layer closure.  SURGEON:  Pieter Partridge, MD  ASSISTANT:  Shelbie Proctor. Shawnie Pons, MD and technician.  ANESTHESIA:  Spinal.  IV FLUIDS:  2400.  ESTIMATED BLOOD LOSS:  300 mL.  URINE OUTPUT:  100.  DRAINS:  Foley catheter.  SPECIMENS:  Placenta to Pathology.  DISPOSITION:  To PACU, hemodynamically stable.  FINDINGS:  Cephalic viable female infant.  Clear amniotic fluid.  Normal- appearing placenta.  Cord pH is 7.2.  Apgars 7 and 8.  Spontaneous cry and active fetal movement.  Normal uterus, ovaries, and fallopian tubes bilaterally.  Nuchal cord x1.  PROCEDURE IN DETAIL:  Ms. Adah Salvage was taken to the operating room where she underwent spinal anesthesia without complication.  She was then placed in the dorsal supine position and prepped and draped in normal sterile fashion.  After the abdomen was marked for Pfannenstiel skin incision, abdomen was remarked after Allis clamp was used to confirm adequate anesthesia.  After adequate anesthesia was performed, a Pfannenstiel skin incision was made 2 cm above the  symphysis pubis and carried down to the underlying layer of the fascia due to morbid obesity.  The patient's pannus had been taped prior, but had significant subcutaneous adipose. Bovie cautery was used as needed for hemostasis.  The fascia was then incised in the midline with the Bovie and extended laterally with the curved Mayo scissors.  Kocher clamps were used to grasp the fascia and retracted, and the rectus muscles were dissected sharply off the fascia. Same was done on the lower edge.  The muscles were then separated at the midline and the peritoneum was identified and tented up and entered sharply with the Metzenbaum scissors and then stretched.  The peritoneal opening was extended with the Metzenbaum scissors to allow for adequate room and the skin incision was also extended.  The bladder flap was then made with the Metzenbaum scissors on the lower uterine segment which was well developed.  A scalpel was then used to make a transverse incision on the lower uterine segment.  The edges of the incision were grasped with the Allis clamps to tent up to identify the membranes.  Once membranes were identified, the incision was extended laterally with the bandage scissors.  Amniotic sac was then ruptured and clear fluid noted. The vertex was near the  incision and was brought up due to significant morbid obesity.  Fundal pressure seemed to be an adequate.  The uterine incision was extended and with compression on the mons pubis, the head was delivered atraumatically.  Nuchal cord x1 was noted and was easily reduced.  Nose and mouth were suctioned.  Shoulders delivered easily and the cord clamped and cut x2.  The baby placed at the bedside to the awaiting NICU Team.  The cord pH was then obtained and then cord blood was obtained.  The placenta was then removed and the hysterotomy incision was grasped with the ring forceps.  The moist laparotomy sponge was used to clear the uterus of all  clots and debris and normal endometrial cavity was noted.  There was minimal bleeding at the hysterotomy incision.  The incision was reapproximated with 0 chromic in a continuous running locked fashion.  Second layer of the same suture was used for imbrication and hemostasis was noted.  Irrigation of the abdomen was performed and then the incision was inspected again and was hemostatic. Bladder appeared to be normal as well and displaced adequately. Intercede was then applied to the lower uterine segment.  The rectus muscles and peritoneum were approximated at the midline with 3 simple interrupted sutures.  The fascia was then reapproximated with 0 chromic in a running locked fashion.  Prior to closure, the fascia and rectus muscles were closely examined for any bleeding.  The subcutaneous space was irrigated and cauterized as needed for hemostasis.  The subcutaneous base was then closed with 2-0 plain gut in 2 layers.  The skin incision was then reapproximated with 4-0 Vicryl on a Keith needle. Steri-Strips with benzoin and pressure dressing was to be applied.  All instruments, sponge, and needle counts were correct x3 and the baby went to the NICU in stable condition.  There were no complications, but the patient got Flagyl and gentamicin.  Prior to surgery, she had received clindamycin approximately 5 hours prior to incision.  She had SCDs on the entire case and they were confirmed to be operating prior to procedure.  She tolerated the procedure well.    Pieter Partridge, MD    EBV/MEDQ  D:  04/03/2013  T:  04/04/2013  Job:  [id number]

## 2013-04-04 NOTE — Progress Notes (Signed)
04/04/13 1200  Clinical Encounter Type  Visited With Health care provider;Patient   Consulted with pt's RN, NT on WU, and checked in briefly with pt.  Have attempted two visits, but she has been sleeping and receiving care.  Will refer to [facility] for follow-up support.  [address] [place], [place] [phone]

## 2013-04-04 NOTE — Progress Notes (Signed)
Neonatal Intensive Care Unit [facility]  [address] [place], [place]  [address] [phone]  NICU Daily Progress Note 04/04/2013 1:52 PM   There are no active problems to display for this patient.    Gestational Age: <None> blank   Wt Readings from Last 3 Encounters:  04/04/13 133925 g (295 lb 4 oz)  04/04/13 133925 g (295 lb 4 oz)  03/08/13 136533 g (301 lb)    Temp:  [36.3 C (97.4 F)-37 C (98.6 F)] 37 C (98.6 F) (05/21 0949) Pulse Rate:  [75-115] 83 (05/21 0949) Resp:  [12-24] 22 (05/21 0949) BP: (112-142)/(51-89) 115/76 mmHg (05/21 0949) SpO2:  [94 %-100 %] 98 % (05/21 0949) Weight:  [914782 g (295 lb 4 oz)] 133925 g (295 lb 4 oz) (05/21 0009)  05/20 0701 - 05/21 0700 In: 3400 [I.V.:3400] Out: 675 [Urine:375; Blood:300]  Total I/O In: -  Out: 1050 [Urine:1050]   Scheduled Meds: . ibuprofen  600 mg Oral Q6H  . measles, mumps and rubella vaccine  0.5 mL Subcutaneous Once  . prenatal multivitamin  1 tablet Oral Q1200  . senna-docusate  2 tablet Oral QHS  . simethicone  80 mg Oral TID PC & HS  . TDaP  0.5 mL Intramuscular Once   Continuous Infusions: . lactated ringers 125 mL/hr at 04/04/13 0524  . oxytocin 40 units in LR 1000 mL     PRN Meds:.dibucaine, diphenhydrAMINE, lanolin, menthol-cetylpyridinium, methylergonovine, methylergonovine, naloxone, ondansetron (ZOFRAN) IV, ondansetron, oxyCODONE-acetaminophen, simethicone, sodium chloride, witch hazel-glycerin, zolpidem  Lab Results  Component Value Date   WBC 9.7 04/04/2013   HGB 10.3* 04/04/2013   HCT 31.8* 04/04/2013   PLT 231 04/04/2013     Lab Results  Component Value Date   NA 135 03/08/2013   K 3.9 03/08/2013   CL 103 03/08/2013   CO2 23 03/08/2013   BUN 7 03/08/2013   CREATININE 0.49* 03/08/2013   CREATININE 0.49* 03/08/2013    Physical Exam General: active, alert Skin: clear, large sacral mongolian spot., signal simian crease. HEENT: anterior fontanel  soft and flat, epicanthal folds CV: Rhythm regular, pulses WNL, cap refill WNL GI: Abdomen soft, non distended, non tender, bowel sounds present GU: normal anatomy Resp: breath sounds clear and equal, chest symmetric, WOB normal Neuro: active, alert, responsive, normal suck, normal cry, symmetric, tone as expected for age and state   Plan  Cardiovascular: Hemodynamically stable, echocardiogram to follow fetal echo consistent with AV canal defect.  GI/FEN: TF at 100 ml/kg/day, feeds started at 30 ml/kg/day.  Voiding and stooling.  Hematologic: Initial CBC WNL.  Infectious Disease: No risk factors know for infection, maternal GBS is pending, will follow closely for s/s infection.  Metabolic/Endocrine/Genetic: Temp and glucose screen stable. Chromosomes sent to evaluate for Trisomy 21. The baby has features suggestive of this diagnosis along with a prenatal Harmony test.  Neurological: She will need a hearing screen prior to discharge. CUS ordered today to follow questionable finding on fetal study.  Respiratory: Stable on HFNC, flow weaned to 2 LPM.  Social: Continue to update and support family.   Leighton Roach NNP-BC Geryl Rankins, MD (Attending)

## 2013-04-05 LAB — CULTURE, BETA STREP (GROUP B ONLY)

## 2013-04-05 NOTE — Progress Notes (Signed)
Post Partum Day 2 s/p cesarean section  Subjective: no complaints, up ad lib, voiding, tolerating PO and + flatus  Objective: Blood pressure 99/68, pulse 107, temperature 97.4 F (36.3 C), temperature source Oral, resp. rate 18, height 5\' 8"  (1.727 m), weight 133.925 kg (295 lb 4 oz), last menstrual period 08/16/2012, SpO2 99.00%, unknown if currently breastfeeding.  Physical Exam:  General: alert and cooperative Lochia: appropriate Uterine Fundus: firm Incision: bandage clean dry and intact  DVT Evaluation: No evidence of DVT seen on physical exam.   Recent Labs  04/03/13 0925 04/04/13 0634  HGB 11.0* 10.3*  HCT 34.2* 31.8*    Assessment/Plan: Plan for discharge tomorrow  routine postpartum care  Pt may shower    LOS: 28 days   Clyde Zarrella J. 04/05/2013, 8:17 AM

## 2013-04-06 ENCOUNTER — Encounter (HOSPITAL_COMMUNITY)
Admission: RE | Admit: 2013-04-06 | Discharge: 2013-04-06 | Disposition: A | Discharge status: Home / Self Care | Payer: BC Managed Care – PPO | Source: Ambulatory Visit | Attending: Obstetrics and Gynecology | Admitting: Obstetrics and Gynecology

## 2013-04-06 DIAGNOSIS — O923 Agalactia: Secondary | ICD-10-CM | POA: Insufficient documentation

## 2013-04-06 MED ORDER — IBUPROFEN 600 MG PO TABS: 600.0000 mg | ORAL_TABLET | Freq: Four times a day (QID) | ORAL | Status: DC

## 2013-04-06 MED ORDER — OXYCODONE-ACETAMINOPHEN 5-325 MG PO TABS: 1.0000 | ORAL_TABLET | ORAL | Status: DC | PRN

## 2013-04-06 NOTE — Discharge Summary (Signed)
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  Prolonged NST, Bedrest GDM-  At time of delivery, pt was on Novalog 8 units AC dinner only.  All other CBG readings were normal. Anemia-was on Iron BID.  Hg at time of c-section 11.0 Intrapartum Procedures: cesarean: low cervical, transverse, GBS prophylaxis and Positive CST PCN allergic.  Clindamycin received during CST.  Got Gentamycin and Flagyl with c-section. Postpartum Procedures: none Complications-Operative and Postpartum: none Hemoglobin  Date Value Range Status  04/04/2013 10.3* 12.0 - 15.0 g/dL Final     HCT  Date Value Range Status  04/04/2013 31.8* 36.0 - 46.0 % Final    Physical Exam:  General: alert, cooperative and no distress Lochia: appropriate Uterine Fundus: firm Incision: Honeycomb dressing clean and dry DVT Evaluation: No evidence of DVT seen on physical exam. Calf/Ankle edema is present.  Discharge Diagnoses: Preterm pregnancy-delivered,  GDM-insulin dep. at dinner only  Discharge Information: Date: 04/06/2013 Activity: pelvic rest Diet: routine Medications: PNV, Ibuprofen and Percocet Condition: improved Instructions: See discharge instructions. Discharge to: home Follow-up Information   Follow up with Geryl Rankins, MD. Schedule an appointment as soon as possible for a visit in 2 weeks. (Post op check)    Contact information:   [address] [place] [address] [phone]       Newborn Data: Live born female  Birth Weight: 3 lb 0.1 oz (1364 g) APGAR: 7, 8, Cord ph 7.2 Placenta with mild chorioamnionitis. GBS culture Negative.  To NICU in stable condition.  Geryl Rankins 04/06/2013, 8:06 AM

## 2013-04-06 NOTE — Progress Notes (Signed)
Pt discharged to home with significant other.  Condition stable.  Pt ambulated to car with K. Kilfoyle, NT.  Pt home with rented breast pump from hospital lactation department.  No equipment for home ordered at discharge.

## 2013-04-06 NOTE — Clinical Social Work Maternal (Signed)
Clinical Social Work Department PSYCHOSOCIAL ASSESSMENT - MATERNAL/CHILD 04/06/2013  Patient:  [name], [name]  Account Number:  [id number]  Admit Date:  03/08/2013  Marjo Bicker Name:   Verdia Kuba    Clinical Social Worker:  Lulu Riding, LCSW   Date/Time:  04/06/2013 10:00 AM  Date Referred:  04/06/2013   Referral source  NICU     Referred reason  NICU   Other referral source:    I:  FAMILY / HOME ENVIRONMENT Child's legal guardian:  PARENT  Guardian - Name Guardian - Age Guardian - Address  Jonie Lassiter [address] [place], [place], [place] [address]  Rebekha Diveley  does not live with MOB   Other household support members/support persons Other support:   Parents state they have a great support system of family and friends.  FOB's family lives in [place] and plans to visit once baby is discharged.  MOB's sister lives in [place] and her parents live in [place], [place].  MOB states she has a lot of friends close by who are very supportive.    II  PSYCHOSOCIAL DATA Information Source:  Family Interview  Financial and [employer] Employment:   MOB works at [employer] and will have 8 weeks of maternity leave.  FOB works at [employer] and states it is flexible although he is not taking any time off at this time.   Financial resources:  Media planner If [employer] - [place]:  [employer] / Grade:   Maternity Care Coordinator / Child Services Coordination / Early Interventions:   Baby may qualify for CDSA, Early Intervention if Trisomy 21 is confirmed and will qualify for CC4C.  Cultural issues impacting care:   None indicated.    III  STRENGTHS Strengths  Adequate Resources  Compliance with medical plan  Home prepared for Child (including basic supplies)  Other - See comment  Supportive family/friends  Understanding of illness   Strength comment:  CSW provided parents with pediatrician list.   IV  RISK FACTORS AND CURRENT  PROBLEMS Current Problem:  None     V  SOCIAL WORK ASSESSMENT  CSW met with parents in MOB's third floor room to complete assessment for NICU admission.  They were very pleasant and welcomed CSW into the room.  MOB states she is feeling well and has mixed emotions about baby's premature delivery.  She states they were aware that baby may come early and although she wishes she could have kept her in longer, she is glad to be able to be out of bed after such a long Antenatal stay.  Parents report having a great support system of family and friends and state no issues with transportation to return to hospital to visit baby.  They say their family has been great to help them get the necessary baby items and feel they have what they need for baby at home.  CSW asked how they have been coping with not only MOB's extended hospitalization, but baby's premature birth and the possibility of a diagnosis of Trisomy 66.  Parents were open and state they feel they are coping well.  They appear to be coping well to CSW.  They are interested in services for [name] if it is confirmed that she has Trisomy 21.  CSW talked about these services briefly.  CSW also informed parents of the possibility that she will qualify for SSI.  CSW told them that we will wait until chromosome results are back and if they choose to  apply to let CSW know and CSW will assist with application process.  CSW discussed signs and symptoms of PPD and asked that MOB call her doctor or CSW if symptoms arise.  MOB states no concerns at this time and agreed to contact a professional if she experiences symptoms.  CSW explained ongoing support services offered by NICU CSW and gave contact information.  Parents thanked CSW for the visit.  CSW has no social concerns at this time.   VI SOCIAL WORK PLAN Social Work Plan  Psychosocial Support/Ongoing Assessment of Needs   Type of pt/family education:   PPD signs and symptoms  Possible eligibility for SSI    If child protective services report - county:   If child protective services report - date:   Information/referral to community resources comment:   No referral needs noted at this time.   Other social work plan:

## 2013-04-12 NOTE — Addendum Note (Signed)
Addendum created 04/12/13 1224 by Jiles Garter, MD   Modules edited: Anesthesia Responsible Staff

## 2014-09-16 ENCOUNTER — Encounter (HOSPITAL_COMMUNITY): Payer: Self-pay | Admitting: Obstetrics and Gynecology

## 2014-12-17 ENCOUNTER — Ambulatory Visit: Payer: Self-pay

## 2015-01-31 ENCOUNTER — Other Ambulatory Visit: Payer: Self-pay | Admitting: Obstetrics and Gynecology

## 2015-01-31 DIAGNOSIS — Z3682 Encounter for antenatal screening for nuchal translucency: Secondary | ICD-10-CM

## 2015-02-13 IMAGING — US US OB FOLLOW-UP
1 series · 12 of 28 positions shown · non-contrast
Comparison: none

[Series 1: us ob follow-up · 0.23mm/px · 12 of 49 slices shown]
[im 2/49]
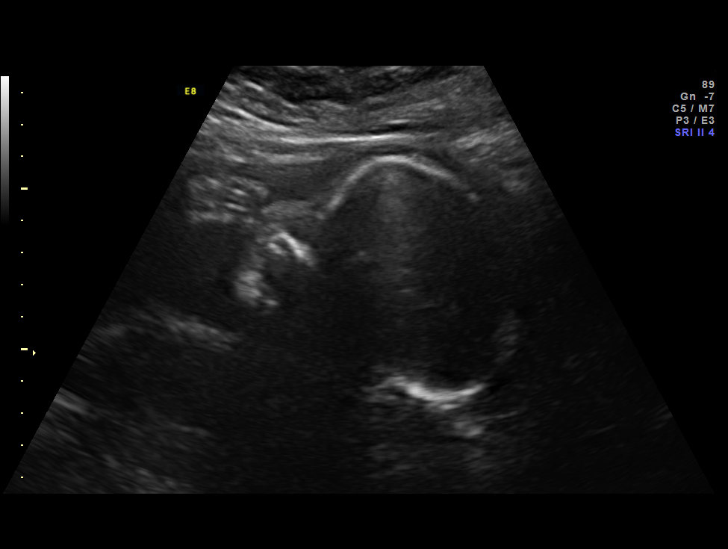
[im 6/49]
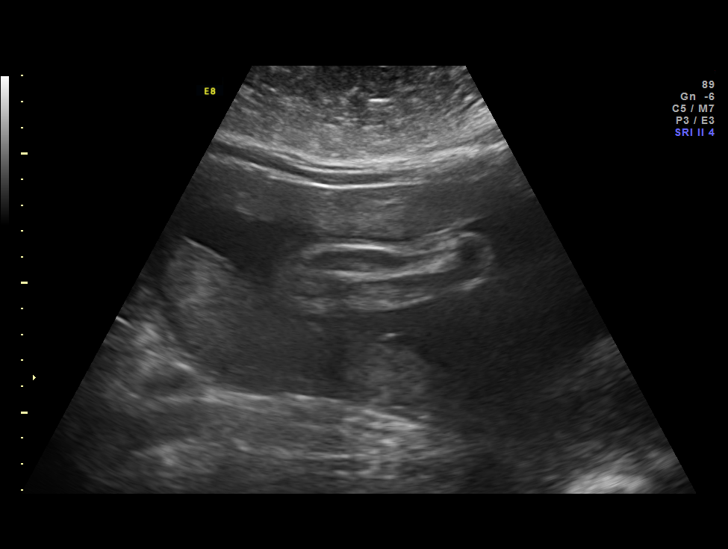
[im 9/49]
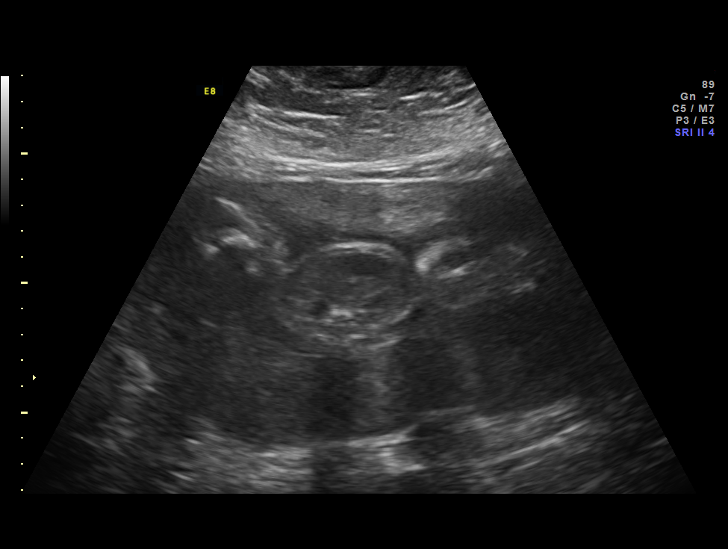
[im 15/49]
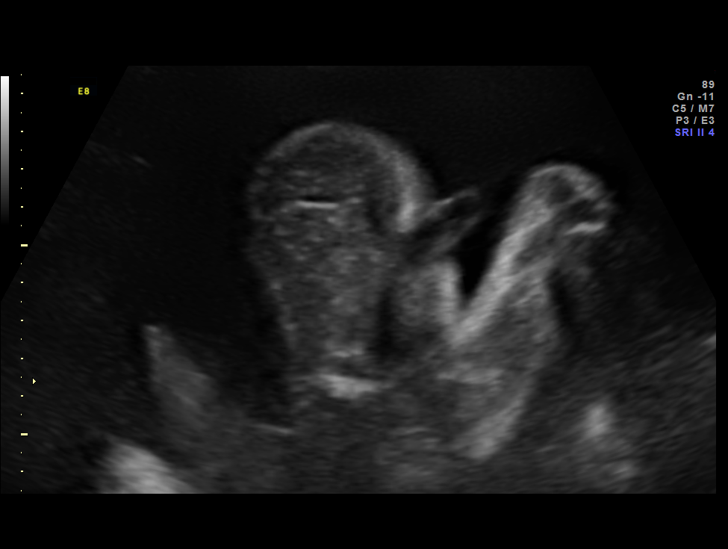
[im 18/49]
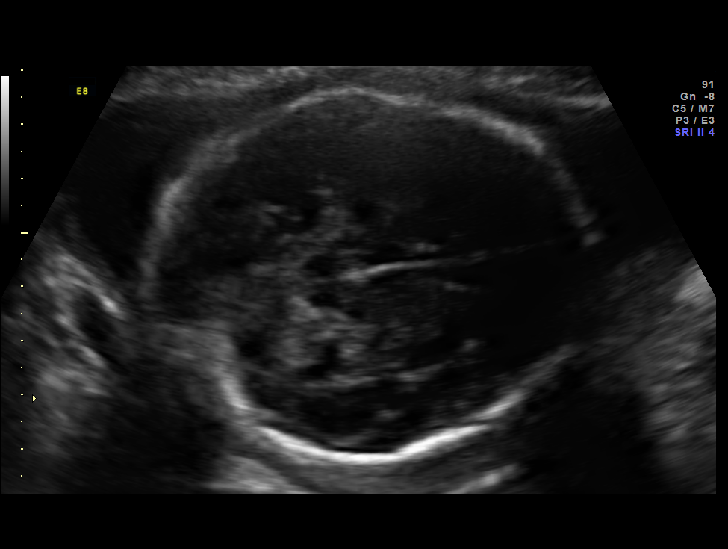
[im 22/49]
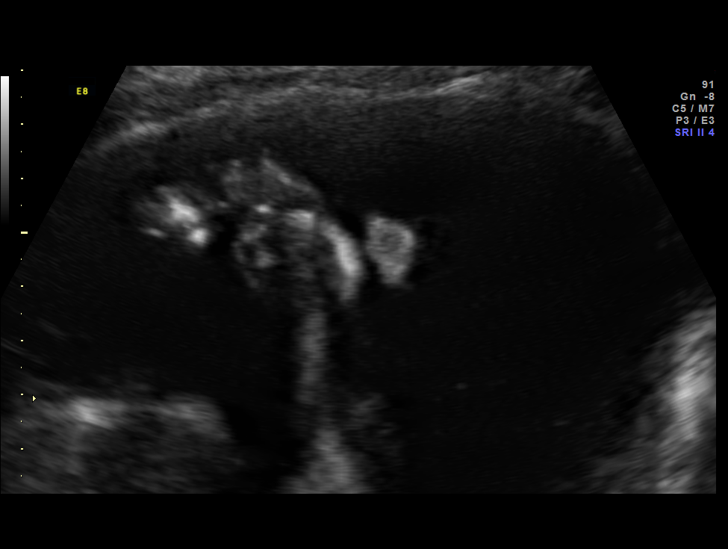
[im 27/49]
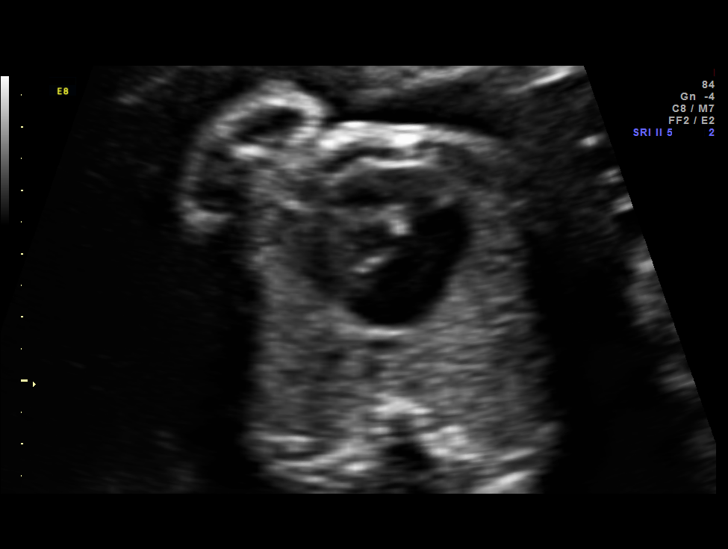
[im 31/49]
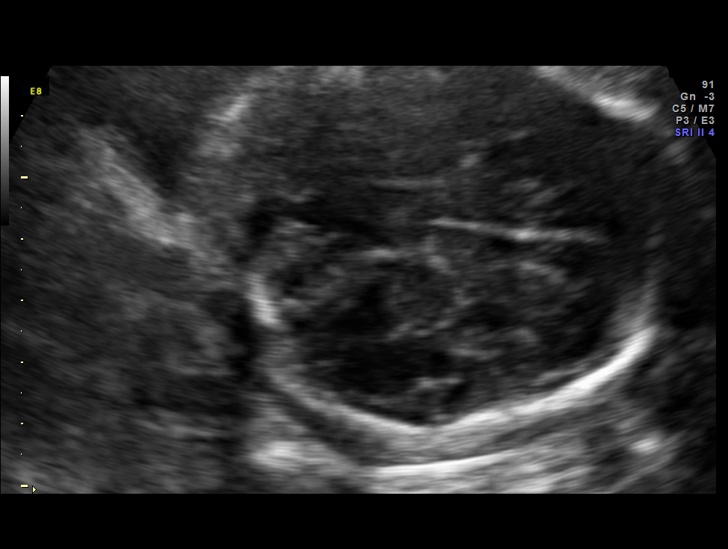
[im 34/49]
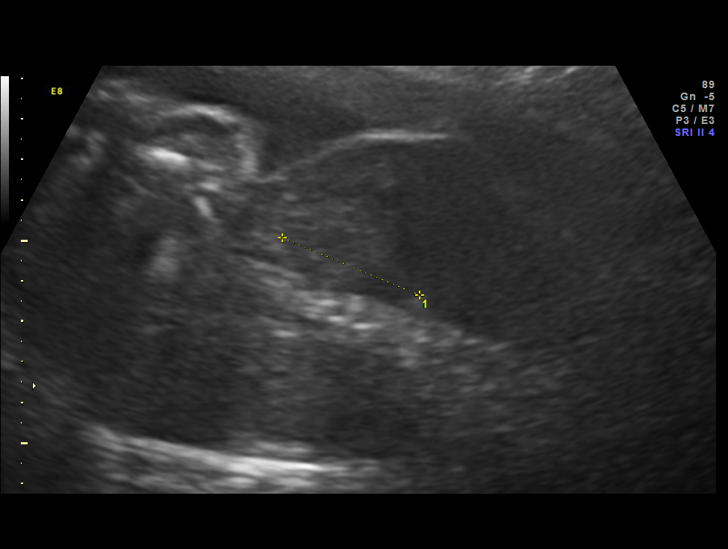
[im 40/49]
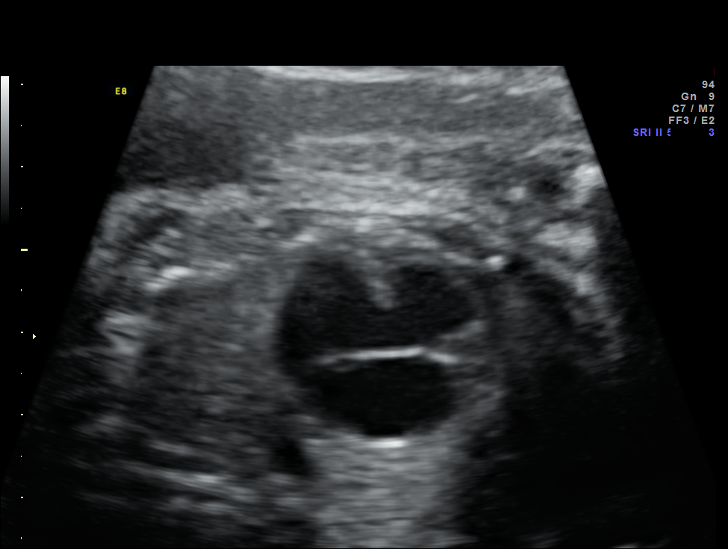
[im 43/49]
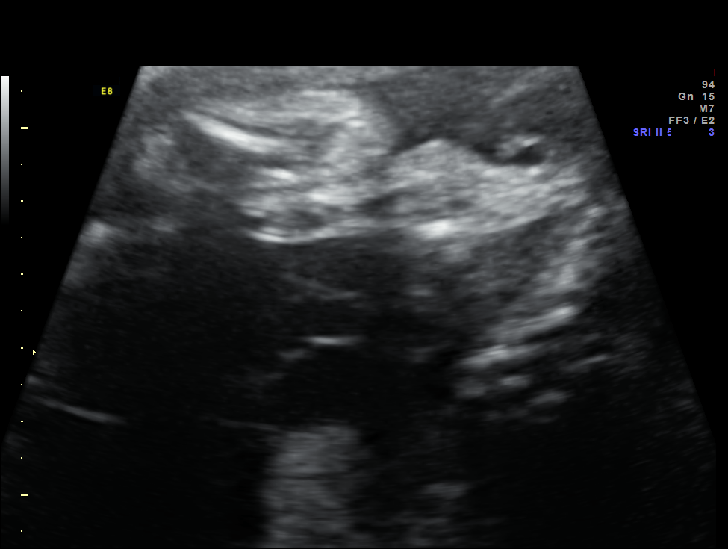
[im 47/49]
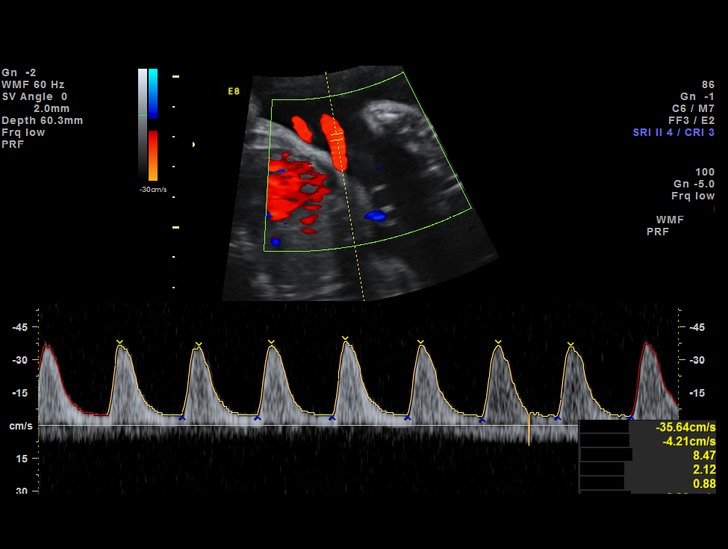

[12 of 28 positions shown; findings below may reference images not displayed]

OBSTETRICS REPORT
                      (Signed Final 03/06/2013 [DATE])

Service(s) Provided

 US OB FOLLOW UP                                       76816.1
 US UA CORD DOPPLER                                    76820.0
Indications

 Maternal morbid obesity (293 lbs)
 Fetal abnormality - other known or suspected
 Diabetes - Gestational
 Positive [id number]
Fetal Evaluation

 Num Of Fetuses:    1
 Fetal Heart Rate:  135                          bpm
 Cardiac Activity:  Observed
 Presentation:      Cephalic
 Placenta:          Posterior, above cervical
                    os
 P. Cord            Previously Visualized
 Insertion:

 Amniotic Fluid
 AFI FV:      Subjectively within normal limits
                                             Larg Pckt:     6.5  cm
Biometry

 BPD:     68.7  mm     G. Age:  27w 4d                CI:         80.1   70 - 86
 OFD:     85.8  mm                                    FL/HC:      20.0   18.8 -

 HC:     244.3  mm     G. Age:  26w 4d      < 3  %    HC/AC:      1.11   1.05 -

 AC:     219.5  mm     G. Age:  26w 3d        5  %    FL/BPD:     71.0   71 - 87
 FL:      48.8  mm     G. Age:  26w 3d      < 3  %    FL/AC:      22.2   20 - 24
 HUM:     39.6  mm     G. Age:  24w 1d      < 5  %

 Est. FW:     942  gm      2 lb 1 oz     20  %
Gestational Age

 LMP:           28w 2d        Date:  08/16/12                 EDD:   05/23/13
 U/S Today:     26w 5d                                        EDD:   06/03/13
 Best:          28w 2d     Det. By:  LMP  (08/16/12)          EDD:   05/23/13
Anatomy

 Cranium:          Previously seen        Aortic Arch:      Previously seen
 Fetal Cavum:      Previously seen        Ductal Arch:      Previously seen
 Ventricles:       Previously seen        Diaphragm:        Appears normal
 Choroid Plexus:   Previously seen        Stomach:          Appears normal, left
                                                            sided
 Cerebellum:       Abnormal, see          Abdomen:          Appears normal
                   comments
 Posterior Fossa:  Abnormal, see          Abdominal Wall:   Appears nml (cord
                   comments
                                                            insert, abd wall)
 Nuchal Fold:      Not applicable (>20    Cord Vessels:     Previously seen
                   wks GA)
 Face:             Appears normal         Kidneys:          Appear normal
                   (orbits and profile)
 Lips:             Appears normal         Bladder:          Appears normal
 Heart:            Abnml 4 chamber,       Spine:            Previously seen
                   see comments
 RVOT:             Appears normal         Lower             Previously seen
                                          Extremities:
 LVOT:             Previously seen        Upper             Previously seen
                                          Extremities:

 Other:  Female gender. Heels previously seen.
Cervix Uterus Adnexa

 Cervical Length:    3.2      cm

 Cervix:       Normal appearance by transabdominal scan. Appears
               closed, without funnelling.
Impression

 IUP at 28+2 weeks
 Screen positive NIPS for trisomy 21; heart defect: AV canal;
 hypoplastic inferior vermis (cerebellum)
 Normal interval anatomy; anatomic survey complete
 Normal amniotic fluid volume
 EFW at the 20th %tile; AC at the 6th %tile
 UA dopplers were elevated for this GA

 The US findings were shared with Ms. Uyex.
Recommendations

 Weekly BPPs with weekly UA dopplers until 32 weeks; then
 twice weekly NSTs with weekly AFIs and UA dopplers
 Growth US in 3 weeks

## 2015-03-07 ENCOUNTER — Ambulatory Visit (HOSPITAL_COMMUNITY)
Admission: RE | Admit: 2015-03-07 | Discharge: 2015-03-07 | Disposition: A | Discharge status: Home / Self Care | Payer: 59 | Source: Ambulatory Visit | Attending: Obstetrics and Gynecology | Admitting: Obstetrics and Gynecology

## 2015-03-07 ENCOUNTER — Encounter (HOSPITAL_COMMUNITY): Payer: Self-pay

## 2015-03-07 DIAGNOSIS — Z315 Encounter for genetic counseling: Secondary | ICD-10-CM | POA: Diagnosis present

## 2015-03-07 DIAGNOSIS — Z3A12 12 weeks gestation of pregnancy: Secondary | ICD-10-CM | POA: Diagnosis not present

## 2015-03-07 DIAGNOSIS — Z8279 Family history of other congenital malformations, deformations and chromosomal abnormalities: Secondary | ICD-10-CM | POA: Diagnosis not present

## 2015-03-07 DIAGNOSIS — O24419 Gestational diabetes mellitus in pregnancy, unspecified control: Secondary | ICD-10-CM | POA: Insufficient documentation

## 2015-03-07 DIAGNOSIS — O09291 Supervision of pregnancy with other poor reproductive or obstetric history, first trimester: Secondary | ICD-10-CM

## 2015-03-07 DIAGNOSIS — Z3682 Encounter for antenatal screening for nuchal translucency: Secondary | ICD-10-CM

## 2015-03-07 DIAGNOSIS — O09299 Supervision of pregnancy with other poor reproductive or obstetric history, unspecified trimester: Secondary | ICD-10-CM | POA: Insufficient documentation

## 2015-03-07 NOTE — Progress Notes (Signed)
Genetic Counseling  High-Risk Gestation Note  Appointment Date:  03/07/2015 Referred By: Thurnell Lose, MD Date of Birth:  1986-11-07 Partner:  Wendy [name]   Pregnancy History: [id number] Estimated Date of Delivery: 09/13/15 Estimated Gestational Age: 2w6dAttending: JViann Fish MD   I met with Ms. Wendy [name] and her partner, Mr. JFenna [name] for genetic counseling because of a previous child with trisomy 274  We began by updating the family history in detail. Ms. LEarlie Countswas [facility] in her previous pregnancy in 2014 given ultrasound findings and an increased risk for Down syndrome from noninvasive prenatal screening (NIPS). Their daughter, Wendy [name] was confirmed postnatally to have trisomy 266 [facility]. Their daughter passed away at age 30 months The couple expressed that they have a good support system with and around each other and are coping appropriately following their loss. We also discussed the local resource of [facility] for individuals who have experience pregnancy or child loss.   We reviewed chromosomes, nondisjunction, and examples of chromosome conditions. They were counseled regarding maternal age and the association with risk for chromosome conditions due to nondisjunction.  They were counseled that the risk for aneuploidy decreases as gestational age increases, accounting for those pregnancies which spontaneously abort.  We specifically discussed Down syndrome (trisomy 228, trisomies 146and 188 and sex chromosome aneuploidies (47,XXX and 47,XXY) including the common features and prognoses of each.   We reviewed that literature suggests that the risk of recurrence for a homo/heterotrisomy depends on the maternal age at the index pregnancy, the maternal age at the time of the subsequent pregnancy, and the specific  trisomy (Warburton 2004).  Considering that Ms. [name] was younger than 30years old at the time of delivery with her daughter and that she is currently 22 her adjusted risk for Down syndrome (homotrisomy) in the current pregnancy is ~1 in 735(1.3%).  The overall risk for any aneuploidy, considering Ms. [name]'s maternal age of 89and her history of a previous child with Down syndrome, is estimated to be approximately 2.6%.     We reviewed other available screening options including first trimester screening, noninvasive prenatal screening (NIPS)/cell free DNA testing, and detailed ultrasound.  We reviewed the benefits and limitations of these tests including the detection rate(s), false positive rate(s), and the specific conditions screened for.  She understands that screening tests provide a pregnancy specific risk for specific conditions, but are not considered to be diagnostic.  The couple was also counseled regarding diagnostic testing via amniocentesis.  We reviewed the approximate 1 in 3409-735risk for complications, including spontaneous pregnancy loss. After consideration of all the options, they elected to pursue NIPS ([facility]). These results will be available in approximately 7-10 days.   Ms. LEarlie Countsalso expressed interest in nuchal translucency assessment via ultrasound today. A complete ultrasound was performed today. Nuchal translucency measurement was unable to be obtained.  The ultrasound report will be sent under separate cover.  Detailed ultrasound was scheduled for 04/18/15.   The family histories were otherwise found to be contributory for report of learning disability for the sister to Mr. WTreaster An underlying cause is not known. She is 30years old and graduated high school. They were counseled that there are many different causes of intellectual disabilities including environmental, multifactorial, and genetic etiologies.  We discussed that a specific  diagnosis for intellectual disability can  be determined in approximately 50% of these individuals.  In the remaining 50% of individuals, a diagnosis may never be determined.  Regarding genetic causes, we discussed that chromosome aberrations (aneuploidy, deletions, duplications, insertions, and translocations) are responsible for a small percentage of individuals with intellectual disability.  Many individuals with chromosome aberrations have additional differences, including congenital anomalies or minor dysmorphisms.  Likewise, single gene conditions are the underlying cause of intellectual delay in some families.  We discussed that many gene conditions have intellectual disability as a feature, but also often include other physical or medical differences.  We discussed that without more specific information, it is difficult to provide an accurate risk assessment.  Further genetic counseling is warranted if more information is obtained.   See previous genetic counseling note from 2014 for previous detailed family history discussion. Without further information regarding the provided family history, an accurate genetic risk cannot be calculated. Further genetic counseling is warranted if more information is obtained.  Ms. Wendy [name] denied exposure to environmental toxins or chemical agents. She denied the use of alcohol, tobacco or street drugs. She denied significant viral illnesses during the course of her pregnancy. Her medical and surgical histories were contributory for gestational diabetes, which she reported is being managed by her OB.   I counseled this couple regarding the above risks and available options.  The approximate face-to-face time with the genetic counselor was 40 minutes.  Chipper Oman, MS Certified Genetic Counselor 03/07/2015

## 2015-03-11 ENCOUNTER — Other Ambulatory Visit (HOSPITAL_COMMUNITY): Payer: Self-pay | Admitting: Obstetrics and Gynecology

## 2015-03-11 ENCOUNTER — Encounter (HOSPITAL_COMMUNITY): Payer: Self-pay | Admitting: Obstetrics and Gynecology

## 2015-03-18 ENCOUNTER — Telehealth (HOSPITAL_COMMUNITY): Payer: Self-pay | Admitting: MS"

## 2015-03-18 NOTE — Telephone Encounter (Signed)
Called [name] to discuss her cell free fetal DNA testing.  Ms. [name] had Panorama testing through [facility].  Testing was offered because of previous child with Trisomy 21.   The patient was identified by name and DOB.  We discussed that no results were obtained from the sample given low fetal fraction in the sample. We discussed that the fetal fraction is expected to increase with gestation and discussed the option of redraw. Per data from  [facility] laboratory, the chance of obtaining a result on a re-draw is approximately 65-85% in this case. Also reviewed option of amniocentesis. Ms. [name] is not interested in amniocentesis. She plans to return to our office on 5/9 at 8:00 am for re-draw for Panorama testing.   Chipper Oman, MS Insurance risk surveyor

## 2015-03-19 ENCOUNTER — Other Ambulatory Visit (HOSPITAL_COMMUNITY): Payer: Self-pay | Admitting: Obstetrics and Gynecology

## 2015-03-19 DIAGNOSIS — O99210 Obesity complicating pregnancy, unspecified trimester: Secondary | ICD-10-CM

## 2015-03-19 DIAGNOSIS — O34219 Maternal care for unspecified type scar from previous cesarean delivery: Secondary | ICD-10-CM

## 2015-03-19 DIAGNOSIS — O352XX0 Maternal care for (suspected) hereditary disease in fetus, not applicable or unspecified: Secondary | ICD-10-CM

## 2015-03-19 DIAGNOSIS — O09299 Supervision of pregnancy with other poor reproductive or obstetric history, unspecified trimester: Secondary | ICD-10-CM

## 2015-03-19 DIAGNOSIS — Z8751 Personal history of pre-term labor: Secondary | ICD-10-CM

## 2015-03-24 ENCOUNTER — Ambulatory Visit (HOSPITAL_COMMUNITY)
Admission: RE | Admit: 2015-03-24 | Discharge: 2015-03-24 | Disposition: A | Discharge status: Home / Self Care | Payer: 59 | Source: Ambulatory Visit | Attending: Obstetrics and Gynecology | Admitting: Obstetrics and Gynecology

## 2015-03-24 DIAGNOSIS — Z315 Encounter for genetic counseling: Secondary | ICD-10-CM | POA: Diagnosis present

## 2015-04-01 ENCOUNTER — Telehealth (HOSPITAL_COMMUNITY): Payer: Self-pay | Admitting: MS"

## 2015-04-01 NOTE — Telephone Encounter (Signed)
Called Ms. [name] regarding noninvasive prenatal screening (NIPS)/cell free DNA testing. Second attempt also yielded no results due to low fetal fraction in the sample. Discussed with [name] that a third sample is not typically recommended by this laboratory and that especially repeating at this gestation would not likely yield a result. We reviewed that an incremental increase in fetal fraction is expected during pregnancy but that the amount of increase is estimated to be greater after approximately [redacted] weeks gestation. We discussed the option of attempting NIPS later in pregnancy and possibly attempting with a different laboratory. However, we also discussed the drawbacks of pursuing a redraw through a different laboratory. We reviewed additional screening options including detailed ultrasound, which is scheduled for 04/18/15, and testing option of amniocentesis. Ms. [name] is not interested in amniocentesis. She stated that she will wait until detailed ultrasound and may feel comfortable with ultrasound only as a screening tool in pregnancy. She was encouraged to call with additional questions or concerns.   Santiago Glad Hasana Alcorta 04/01/2015 4:25 PM

## 2015-04-02 ENCOUNTER — Other Ambulatory Visit (HOSPITAL_COMMUNITY): Payer: Self-pay | Admitting: Obstetrics and Gynecology

## 2015-04-03 ENCOUNTER — Encounter: Payer: 59 | Attending: Obstetrics and Gynecology | Admitting: *Deleted

## 2015-04-03 ENCOUNTER — Ambulatory Visit (HOSPITAL_COMMUNITY)
Admission: RE | Admit: 2015-04-03 | Discharge: 2015-04-03 | Disposition: A | Discharge status: Home / Self Care | Payer: 59 | Source: Ambulatory Visit | Attending: Obstetrics and Gynecology | Admitting: Obstetrics and Gynecology

## 2015-04-03 DIAGNOSIS — O24419 Gestational diabetes mellitus in pregnancy, unspecified control: Secondary | ICD-10-CM | POA: Diagnosis not present

## 2015-04-03 NOTE — Progress Notes (Signed)
  Patient was seen on 04/03/15 for Gestational Diabetes self-management . The following learning objectives were met by the patient :   States the definition of Gestational Diabetes  States why dietary management is important in controlling blood glucose  Describes the effects of carbohydrates on blood glucose levels  Demonstrates ability to create a balanced meal plan  Demonstrates carbohydrate counting   States when to check blood glucose levels  Demonstrates proper blood glucose monitoring techniques  States the effect of stress and exercise on blood glucose levels  States the importance of limiting caffeine and abstaining from alcohol and smoking  Plan:  Aim for 2 Carb Choices per meal (30 grams) +/- 1 either way for breakfast Aim for 3 Carb Choices per meal (45 grams) +/- 1 either way from lunch and dinner Aim for 1-2 Carbs per snack Begin reading food labels for Total Carbohydrate and sugar grams of foods Consider  increasing your activity level by walking daily as tolerated Begin checking BG before breakfast and 2 hours after first bit of breakfast, lunch and dinner after  as directed by MD  Take medication  as directed by MD  Blood glucose monitor given: Patient already testing her MD. Has One Touch Ultra Mini FBS range 78-$RemoveBefo'111mg'DYGxhryQbGK$ /dl, 2hpp range 89-$RemoveBefor'211mg'QCrZlddnXlhV$ /dl  Patient instructed to monitor glucose levels: FBS: 60 - <90 2 hour: <120  Patient received the following handouts:  Nutrition Diabetes and Pregnancy  Carbohydrate Counting List  Meal Planning worksheet  Patient will be seen for follow-up as needed.

## 2015-04-18 ENCOUNTER — Ambulatory Visit (HOSPITAL_COMMUNITY)
Admission: RE | Admit: 2015-04-18 | Discharge: 2015-04-18 | Disposition: A | Discharge status: Home / Self Care | Payer: 59 | Source: Ambulatory Visit | Attending: Obstetrics and Gynecology | Admitting: Obstetrics and Gynecology

## 2015-04-18 ENCOUNTER — Other Ambulatory Visit (HOSPITAL_COMMUNITY): Payer: Self-pay | Admitting: Obstetrics and Gynecology

## 2015-04-18 ENCOUNTER — Encounter (HOSPITAL_COMMUNITY): Payer: Self-pay

## 2015-04-18 ENCOUNTER — Other Ambulatory Visit (HOSPITAL_COMMUNITY): Payer: Self-pay | Admitting: Maternal and Fetal Medicine

## 2015-04-18 DIAGNOSIS — O352XX0 Maternal care for (suspected) hereditary disease in fetus, not applicable or unspecified: Secondary | ICD-10-CM | POA: Insufficient documentation

## 2015-04-18 DIAGNOSIS — E119 Type 2 diabetes mellitus without complications: Secondary | ICD-10-CM | POA: Diagnosis not present

## 2015-04-18 DIAGNOSIS — O3421 Maternal care for scar from previous cesarean delivery: Secondary | ICD-10-CM | POA: Diagnosis not present

## 2015-04-18 DIAGNOSIS — Z8751 Personal history of pre-term labor: Secondary | ICD-10-CM

## 2015-04-18 DIAGNOSIS — Z713 Dietary counseling and surveillance: Secondary | ICD-10-CM | POA: Insufficient documentation

## 2015-04-18 DIAGNOSIS — O99210 Obesity complicating pregnancy, unspecified trimester: Secondary | ICD-10-CM

## 2015-04-18 DIAGNOSIS — Z3A18 18 weeks gestation of pregnancy: Secondary | ICD-10-CM | POA: Diagnosis not present

## 2015-04-18 DIAGNOSIS — O09292 Supervision of pregnancy with other poor reproductive or obstetric history, second trimester: Secondary | ICD-10-CM | POA: Insufficient documentation

## 2015-04-18 DIAGNOSIS — O99212 Obesity complicating pregnancy, second trimester: Secondary | ICD-10-CM | POA: Insufficient documentation

## 2015-04-18 DIAGNOSIS — O34219 Maternal care for unspecified type scar from previous cesarean delivery: Secondary | ICD-10-CM

## 2015-04-18 DIAGNOSIS — O24312 Unspecified pre-existing diabetes mellitus in pregnancy, second trimester: Secondary | ICD-10-CM | POA: Insufficient documentation

## 2015-04-18 DIAGNOSIS — Z3689 Encounter for other specified antenatal screening: Secondary | ICD-10-CM | POA: Insufficient documentation

## 2015-04-18 DIAGNOSIS — O09299 Supervision of pregnancy with other poor reproductive or obstetric history, unspecified trimester: Secondary | ICD-10-CM

## 2015-04-21 ENCOUNTER — Other Ambulatory Visit (HOSPITAL_COMMUNITY): Payer: Self-pay | Admitting: Obstetrics and Gynecology

## 2015-04-29 ENCOUNTER — Ambulatory Visit (HOSPITAL_COMMUNITY)
Admission: RE | Admit: 2015-04-29 | Discharge: 2015-04-29 | Disposition: A | Discharge status: Home / Self Care | Payer: 59 | Source: Ambulatory Visit | Attending: Obstetrics and Gynecology | Admitting: Obstetrics and Gynecology

## 2015-04-29 ENCOUNTER — Encounter (HOSPITAL_COMMUNITY): Payer: Self-pay

## 2015-04-29 ENCOUNTER — Other Ambulatory Visit (HOSPITAL_COMMUNITY): Payer: Self-pay | Admitting: Obstetrics and Gynecology

## 2015-04-29 DIAGNOSIS — O24419 Gestational diabetes mellitus in pregnancy, unspecified control: Secondary | ICD-10-CM | POA: Insufficient documentation

## 2015-04-29 DIAGNOSIS — Z79899 Other long term (current) drug therapy: Secondary | ICD-10-CM | POA: Insufficient documentation

## 2015-04-29 DIAGNOSIS — Z3A20 20 weeks gestation of pregnancy: Secondary | ICD-10-CM | POA: Diagnosis not present

## 2015-04-29 NOTE — Consult Note (Signed)
Maternal Fetal Medicine Consultation  Requesting Provider(s): Thurnell Lose, MD  Reason for consultation: A2 GDM - assess need for insulin  HPI: [name] is a 14 yoG2P0100, EDD 09/13/2015 currently at [redacted]w[redacted]d who was seen for consultation due to A2 GDM and to evaluate need for insulin therapy.  [name] was diagnosed with GDM in early pregnancy.  Her initial 3 hr OGTT was 103/199/193/141 and HbA1C was 5.9%.  She was initially started on a low dose of glyburide at night due to elevated fasting sugars - she is currently on Glyburide 5 mg qAM.  In the recent past, she has had fingerstick values "all over the place".  She was recently seen for diabetic education.  Fingerstick values over the last 2 weeks - majority of fasting values < 90.  Most post prandial values in target range with exception of a few elevated values.  She is without complaints today.  [name] past OB history is remarkable for a previous child with complete AV canal and Trisomy 21.  The child died due to heart complications after birth.  Panorama x 2 was performed this pregnancy that showed inadequate fetal fraction on both occasions.  She had a normal fetal echo earlier this pregnancy.  OB History: OB History    Gravida Para Term Preterm AB TAB SAB Ectopic Multiple Living   2 1  1       0      PMH:  Past Medical History  Diagnosis Date  . Abnormal Pap smear 2011  . Sleep apnea 2004  . Anemia   . Fibroid   . Gestational diabetes   . Pre-existing diabetes mellitus affecting pregnancy in second trimester, antepartum     PSH:  Past Surgical History  Procedure Laterality Date  . Cyst on foot    . Cesarean section N/A 04/03/2013    Procedure: CESAREAN SECTION;  Surgeon: Thurnell Lose, MD;  Location: [facility];  Service: Obstetrics;  Laterality: N/A;   Meds:  Current Outpatient Prescriptions on File Prior to Encounter  Medication Sig Dispense Refill  . Fe Fum-FePoly-Vit C-Vit B3 (INTEGRA PO) Take 1  capsule by mouth daily.    Marland Kitchen glyBURIDE (DIABETA) 2.5 MG tablet Take 2.5 mg by mouth every evening.    . Prenatal Vit-Fe Fumarate-FA (PRENATAL MULTIVITAMIN) TABS Take 1 tablet by mouth daily.    Marland Kitchen ibuprofen (ADVIL,MOTRIN) 600 MG tablet Take 1 tablet (600 mg total) by mouth every 6 (six) hours. (Patient not taking: Reported on 03/07/2015) 30 tablet 0  . oxyCODONE-acetaminophen (PERCOCET/ROXICET) 5-325 MG per tablet Take 1-2 tablets by mouth every 4 (four) hours as needed. (Patient not taking: Reported on 03/07/2015) 30 tablet 0   No current facility-administered medications on file prior to encounter.   Allergies:  Allergies  Allergen Reactions  . Penicillins Other (See Comments)    Sleeps a lot more than usual  . Shellfish Allergy Hives   FH:  Family History  Problem Relation Age of Onset  . Hypertension Mother   . Arthritis Father   . Diabetes Father   . Asthma Brother   . Hypertension Brother   . Diabetes Maternal Uncle   . Arthritis Maternal Grandmother   . Diabetes Maternal Grandmother   . Cancer Maternal Grandfather   . COPD Maternal Grandfather   . Down syndrome Daughter     Soc:  History   Social History  . Marital Status: Single    Spouse Name: N/A  . Number of Children: N/A  .  Years of Education: N/A   Occupational History  . Not on file.   Social History Main Topics  . Smoking status: Never Smoker   . Smokeless tobacco: Never Used  . Alcohol Use: No  . Drug Use: No  . Sexual Activity: Yes   Other Topics Concern  . Not on file   Social History Narrative    Review of Systems: no vaginal bleeding or cramping/contractions, no LOF, no nausea/vomiting. All other systems reviewed and are negative.  PNL:   PE:   Filed Vitals:   04/29/15 0935  BP: 120/80  [name]: 92    GEN: well-appearing female ABD: gravid, NT  A/P:  1) Single IUP at 20w 3d  2) A2 GDM on glyburide - the patient's blood sugars are currently well controlled on her current dose of  Glyburide and she reports that she is doing her best to adhere to her diabetic diet and seems to be compliant with checking her fingerstick values.  At this time, I would not recommend transitioning to insulin.   Recommendations: 1) Continue close follow up with prenatal visits/ fingerstick reviews at least every 2 weeks.  Would titrate up her Glyburide dose as needed to maintain fingerstick values within the target range - may increase up to a maximum dose of 10 mg BID.  If she is poorly controlled on this dose, would transition to insulin.  We would be happy to assist should this be required. 2) Continue serial growth scans every 4-6 weeks.  The patient has a follow up ultrasound scheduled with MFM in July. 3) Antenatal testing beginning at 32 weeks (2x weekly NSTs with weekly AFIs) 4) Recommend delivery by 39 weeks in the absence of other complications.   Thank you for the opportunity to be a part of the care of [name]. Please contact our office if we can be of further assistance.   I spent approximately 30 minutes with this patient with over 50% of time spent in face-to-face counseling.  Benjaman Lobe, MD Maternal Fetal Medicine

## 2015-05-30 ENCOUNTER — Ambulatory Visit (HOSPITAL_COMMUNITY): Payer: 59

## 2015-06-03 ENCOUNTER — Ambulatory Visit (HOSPITAL_COMMUNITY)
Admission: RE | Admit: 2015-06-03 | Discharge: 2015-06-03 | Disposition: A | Discharge status: Home / Self Care | Payer: 59 | Source: Ambulatory Visit | Attending: Obstetrics and Gynecology | Admitting: Obstetrics and Gynecology

## 2015-06-03 DIAGNOSIS — O99210 Obesity complicating pregnancy, unspecified trimester: Secondary | ICD-10-CM | POA: Insufficient documentation

## 2015-06-03 DIAGNOSIS — Z8751 Personal history of pre-term labor: Secondary | ICD-10-CM

## 2015-06-03 DIAGNOSIS — O3421 Maternal care for scar from previous cesarean delivery: Secondary | ICD-10-CM | POA: Diagnosis not present

## 2015-06-03 DIAGNOSIS — O34219 Maternal care for unspecified type scar from previous cesarean delivery: Secondary | ICD-10-CM

## 2015-06-03 DIAGNOSIS — Z3A25 25 weeks gestation of pregnancy: Secondary | ICD-10-CM | POA: Insufficient documentation

## 2015-06-03 DIAGNOSIS — O09299 Supervision of pregnancy with other poor reproductive or obstetric history, unspecified trimester: Secondary | ICD-10-CM | POA: Diagnosis present

## 2015-06-03 DIAGNOSIS — O352XX0 Maternal care for (suspected) hereditary disease in fetus, not applicable or unspecified: Secondary | ICD-10-CM

## 2015-06-03 DIAGNOSIS — E119 Type 2 diabetes mellitus without complications: Secondary | ICD-10-CM | POA: Insufficient documentation

## 2015-06-25 ENCOUNTER — Other Ambulatory Visit (HOSPITAL_COMMUNITY): Payer: Self-pay | Admitting: Obstetrics and Gynecology

## 2015-06-25 DIAGNOSIS — O99210 Obesity complicating pregnancy, unspecified trimester: Secondary | ICD-10-CM

## 2015-06-25 DIAGNOSIS — Z3A29 29 weeks gestation of pregnancy: Secondary | ICD-10-CM

## 2015-06-25 DIAGNOSIS — O09293 Supervision of pregnancy with other poor reproductive or obstetric history, third trimester: Secondary | ICD-10-CM

## 2015-06-25 DIAGNOSIS — O352XX0 Maternal care for (suspected) hereditary disease in fetus, not applicable or unspecified: Secondary | ICD-10-CM

## 2015-06-25 DIAGNOSIS — O24313 Unspecified pre-existing diabetes mellitus in pregnancy, third trimester: Secondary | ICD-10-CM

## 2015-06-25 DIAGNOSIS — Z98891 History of uterine scar from previous surgery: Secondary | ICD-10-CM

## 2015-07-01 ENCOUNTER — Ambulatory Visit (HOSPITAL_COMMUNITY)
Admission: RE | Admit: 2015-07-01 | Discharge: 2015-07-01 | Disposition: A | Discharge status: Home / Self Care | Payer: Medicaid Other | Source: Ambulatory Visit | Attending: Obstetrics and Gynecology | Admitting: Obstetrics and Gynecology

## 2015-07-01 VITALS — BP 115/82 | HR 90 | Wt 321.8 lb

## 2015-07-01 DIAGNOSIS — O99210 Obesity complicating pregnancy, unspecified trimester: Secondary | ICD-10-CM | POA: Diagnosis not present

## 2015-07-01 DIAGNOSIS — E119 Type 2 diabetes mellitus without complications: Secondary | ICD-10-CM

## 2015-07-01 DIAGNOSIS — O352XX0 Maternal care for (suspected) hereditary disease in fetus, not applicable or unspecified: Secondary | ICD-10-CM | POA: Diagnosis not present

## 2015-07-01 DIAGNOSIS — Z3A29 29 weeks gestation of pregnancy: Secondary | ICD-10-CM | POA: Diagnosis not present

## 2015-07-01 DIAGNOSIS — O09299 Supervision of pregnancy with other poor reproductive or obstetric history, unspecified trimester: Secondary | ICD-10-CM | POA: Diagnosis present

## 2015-07-01 DIAGNOSIS — Z6830 Body mass index (BMI) 30.0-30.9, adult: Secondary | ICD-10-CM | POA: Insufficient documentation

## 2015-07-01 DIAGNOSIS — O24313 Unspecified pre-existing diabetes mellitus in pregnancy, third trimester: Secondary | ICD-10-CM

## 2015-07-01 DIAGNOSIS — O09293 Supervision of pregnancy with other poor reproductive or obstetric history, third trimester: Secondary | ICD-10-CM

## 2015-07-01 DIAGNOSIS — Z98891 History of uterine scar from previous surgery: Secondary | ICD-10-CM

## 2015-07-22 ENCOUNTER — Encounter (HOSPITAL_COMMUNITY): Payer: Self-pay

## 2015-07-22 ENCOUNTER — Ambulatory Visit (HOSPITAL_COMMUNITY)
Admission: RE | Admit: 2015-07-22 | Discharge: 2015-07-22 | Disposition: A | Discharge status: Home / Self Care | Payer: 59 | Source: Ambulatory Visit | Attending: Obstetrics and Gynecology | Admitting: Obstetrics and Gynecology

## 2015-07-22 DIAGNOSIS — Z3A32 32 weeks gestation of pregnancy: Secondary | ICD-10-CM | POA: Diagnosis not present

## 2015-07-22 DIAGNOSIS — O24419 Gestational diabetes mellitus in pregnancy, unspecified control: Secondary | ICD-10-CM | POA: Diagnosis present

## 2015-07-22 NOTE — Progress Notes (Signed)
NST note  29 yo G2P0100 currently at 36 3/7 weeks GDMA2 on glyburide, previous child with Trisomy 21  Reactive NST No contractions  Benjaman Lobe, MD

## 2015-07-25 ENCOUNTER — Other Ambulatory Visit (HOSPITAL_COMMUNITY): Payer: Self-pay | Admitting: Maternal and Fetal Medicine

## 2015-07-25 ENCOUNTER — Encounter (HOSPITAL_COMMUNITY): Payer: Self-pay

## 2015-07-25 ENCOUNTER — Ambulatory Visit (HOSPITAL_COMMUNITY)
Admission: RE | Admit: 2015-07-25 | Discharge: 2015-07-25 | Disposition: A | Discharge status: Home / Self Care | Payer: 59 | Source: Ambulatory Visit | Attending: Obstetrics and Gynecology | Admitting: Obstetrics and Gynecology

## 2015-07-25 DIAGNOSIS — O09293 Supervision of pregnancy with other poor reproductive or obstetric history, third trimester: Secondary | ICD-10-CM | POA: Diagnosis not present

## 2015-07-25 DIAGNOSIS — Z8632 Personal history of gestational diabetes: Secondary | ICD-10-CM

## 2015-07-25 DIAGNOSIS — E119 Type 2 diabetes mellitus without complications: Secondary | ICD-10-CM

## 2015-07-25 DIAGNOSIS — Z3A32 32 weeks gestation of pregnancy: Secondary | ICD-10-CM | POA: Diagnosis not present

## 2015-07-25 DIAGNOSIS — E669 Obesity, unspecified: Secondary | ICD-10-CM | POA: Diagnosis not present

## 2015-07-25 DIAGNOSIS — O99213 Obesity complicating pregnancy, third trimester: Secondary | ICD-10-CM

## 2015-07-25 DIAGNOSIS — O3421 Maternal care for scar from previous cesarean delivery: Secondary | ICD-10-CM | POA: Insufficient documentation

## 2015-07-25 DIAGNOSIS — O24419 Gestational diabetes mellitus in pregnancy, unspecified control: Secondary | ICD-10-CM | POA: Insufficient documentation

## 2015-07-25 DIAGNOSIS — O352XX0 Maternal care for (suspected) hereditary disease in fetus, not applicable or unspecified: Secondary | ICD-10-CM

## 2015-07-25 DIAGNOSIS — O34219 Maternal care for unspecified type scar from previous cesarean delivery: Secondary | ICD-10-CM

## 2015-07-25 NOTE — Progress Notes (Signed)
MFM NST  29 yr old G2P0100 at [redacted]w[redacted]d with [place] for NST  Baseline 150bpm Moderate variability + accelerations  no decelerations Reactive NST  Continue antenatal testing  [place], MD

## 2015-07-29 ENCOUNTER — Ambulatory Visit (HOSPITAL_COMMUNITY)
Admission: RE | Admit: 2015-07-29 | Discharge: 2015-07-29 | Disposition: A | Discharge status: Home / Self Care | Payer: 59 | Source: Ambulatory Visit | Attending: Obstetrics and Gynecology | Admitting: Obstetrics and Gynecology

## 2015-07-29 ENCOUNTER — Encounter (HOSPITAL_COMMUNITY): Payer: Self-pay

## 2015-07-29 VITALS — BP 113/59 | HR 94 | Wt 323.4 lb

## 2015-07-29 DIAGNOSIS — O24313 Unspecified pre-existing diabetes mellitus in pregnancy, third trimester: Secondary | ICD-10-CM | POA: Diagnosis not present

## 2015-07-29 DIAGNOSIS — O352XX0 Maternal care for (suspected) hereditary disease in fetus, not applicable or unspecified: Secondary | ICD-10-CM | POA: Diagnosis not present

## 2015-07-29 DIAGNOSIS — O99213 Obesity complicating pregnancy, third trimester: Secondary | ICD-10-CM

## 2015-07-29 DIAGNOSIS — Z3A33 33 weeks gestation of pregnancy: Secondary | ICD-10-CM | POA: Diagnosis not present

## 2015-07-29 DIAGNOSIS — O3421 Maternal care for scar from previous cesarean delivery: Secondary | ICD-10-CM | POA: Insufficient documentation

## 2015-07-29 DIAGNOSIS — O24312 Unspecified pre-existing diabetes mellitus in pregnancy, second trimester: Secondary | ICD-10-CM

## 2015-08-01 ENCOUNTER — Ambulatory Visit (HOSPITAL_COMMUNITY)
Admission: RE | Admit: 2015-08-01 | Discharge: 2015-08-01 | Disposition: A | Discharge status: Home / Self Care | Payer: 59 | Source: Ambulatory Visit | Attending: Maternal and Fetal Medicine | Admitting: Maternal and Fetal Medicine

## 2015-08-01 ENCOUNTER — Other Ambulatory Visit (HOSPITAL_COMMUNITY): Payer: Self-pay | Admitting: Maternal and Fetal Medicine

## 2015-08-01 ENCOUNTER — Encounter (HOSPITAL_COMMUNITY): Payer: Self-pay

## 2015-08-01 ENCOUNTER — Ambulatory Visit (HOSPITAL_COMMUNITY)
Admission: RE | Admit: 2015-08-01 | Discharge: 2015-08-01 | Disposition: A | Discharge status: Home / Self Care | Payer: 59 | Source: Ambulatory Visit

## 2015-08-01 DIAGNOSIS — Z8632 Personal history of gestational diabetes: Secondary | ICD-10-CM

## 2015-08-01 DIAGNOSIS — Z3A33 33 weeks gestation of pregnancy: Secondary | ICD-10-CM

## 2015-08-01 DIAGNOSIS — E119 Type 2 diabetes mellitus without complications: Secondary | ICD-10-CM

## 2015-08-01 DIAGNOSIS — O352XX0 Maternal care for (suspected) hereditary disease in fetus, not applicable or unspecified: Secondary | ICD-10-CM

## 2015-08-01 DIAGNOSIS — O99213 Obesity complicating pregnancy, third trimester: Secondary | ICD-10-CM | POA: Insufficient documentation

## 2015-08-01 DIAGNOSIS — O34219 Maternal care for unspecified type scar from previous cesarean delivery: Secondary | ICD-10-CM

## 2015-08-01 DIAGNOSIS — O09299 Supervision of pregnancy with other poor reproductive or obstetric history, unspecified trimester: Secondary | ICD-10-CM

## 2015-08-01 DIAGNOSIS — O3421 Maternal care for scar from previous cesarean delivery: Secondary | ICD-10-CM | POA: Insufficient documentation

## 2015-08-01 DIAGNOSIS — E669 Obesity, unspecified: Secondary | ICD-10-CM | POA: Insufficient documentation

## 2015-08-05 ENCOUNTER — Encounter (HOSPITAL_COMMUNITY): Payer: Self-pay

## 2015-08-05 ENCOUNTER — Ambulatory Visit (HOSPITAL_COMMUNITY)
Admission: RE | Admit: 2015-08-05 | Discharge: 2015-08-05 | Disposition: A | Discharge status: Home / Self Care | Payer: Medicaid Other | Source: Ambulatory Visit | Attending: Obstetrics and Gynecology | Admitting: Obstetrics and Gynecology

## 2015-08-05 ENCOUNTER — Other Ambulatory Visit: Payer: Self-pay | Admitting: Obstetrics and Gynecology

## 2015-08-05 DIAGNOSIS — O288 Other abnormal findings on antenatal screening of mother: Secondary | ICD-10-CM

## 2015-08-05 DIAGNOSIS — Z3A34 34 weeks gestation of pregnancy: Secondary | ICD-10-CM | POA: Diagnosis not present

## 2015-08-05 DIAGNOSIS — O352XX0 Maternal care for (suspected) hereditary disease in fetus, not applicable or unspecified: Secondary | ICD-10-CM | POA: Diagnosis not present

## 2015-08-05 DIAGNOSIS — O24313 Unspecified pre-existing diabetes mellitus in pregnancy, third trimester: Secondary | ICD-10-CM | POA: Insufficient documentation

## 2015-08-08 ENCOUNTER — Ambulatory Visit (HOSPITAL_COMMUNITY)
Admission: RE | Admit: 2015-08-08 | Discharge: 2015-08-08 | Disposition: A | Discharge status: Home / Self Care | Payer: Medicaid Other | Source: Ambulatory Visit | Attending: Obstetrics and Gynecology | Admitting: Obstetrics and Gynecology

## 2015-08-08 ENCOUNTER — Other Ambulatory Visit (HOSPITAL_COMMUNITY): Payer: Self-pay | Admitting: Maternal and Fetal Medicine

## 2015-08-08 VITALS — BP 124/88 | HR 107 | Wt 326.2 lb

## 2015-08-08 DIAGNOSIS — O09293 Supervision of pregnancy with other poor reproductive or obstetric history, third trimester: Secondary | ICD-10-CM | POA: Insufficient documentation

## 2015-08-08 DIAGNOSIS — Z3A34 34 weeks gestation of pregnancy: Secondary | ICD-10-CM | POA: Insufficient documentation

## 2015-08-08 DIAGNOSIS — O3421 Maternal care for scar from previous cesarean delivery: Secondary | ICD-10-CM | POA: Diagnosis not present

## 2015-08-08 DIAGNOSIS — Z8632 Personal history of gestational diabetes: Secondary | ICD-10-CM

## 2015-08-08 DIAGNOSIS — O99213 Obesity complicating pregnancy, third trimester: Secondary | ICD-10-CM

## 2015-08-08 DIAGNOSIS — O352XX1 Maternal care for (suspected) hereditary disease in fetus, fetus 1: Secondary | ICD-10-CM

## 2015-08-08 DIAGNOSIS — O24313 Unspecified pre-existing diabetes mellitus in pregnancy, third trimester: Secondary | ICD-10-CM | POA: Insufficient documentation

## 2015-08-08 DIAGNOSIS — O34219 Maternal care for unspecified type scar from previous cesarean delivery: Secondary | ICD-10-CM

## 2015-08-08 DIAGNOSIS — E119 Type 2 diabetes mellitus without complications: Secondary | ICD-10-CM

## 2015-08-08 DIAGNOSIS — E669 Obesity, unspecified: Secondary | ICD-10-CM | POA: Diagnosis not present

## 2015-08-08 DIAGNOSIS — O352XX0 Maternal care for (suspected) hereditary disease in fetus, not applicable or unspecified: Secondary | ICD-10-CM | POA: Insufficient documentation

## 2015-08-08 DIAGNOSIS — O24013 Pre-existing diabetes mellitus, type 1, in pregnancy, third trimester: Secondary | ICD-10-CM

## 2015-08-12 ENCOUNTER — Ambulatory Visit (HOSPITAL_COMMUNITY)
Admission: RE | Admit: 2015-08-12 | Discharge: 2015-08-12 | Disposition: A | Discharge status: Home / Self Care | Payer: Medicaid Other | Source: Ambulatory Visit | Attending: Obstetrics and Gynecology | Admitting: Obstetrics and Gynecology

## 2015-08-12 DIAGNOSIS — O24313 Unspecified pre-existing diabetes mellitus in pregnancy, third trimester: Secondary | ICD-10-CM | POA: Insufficient documentation

## 2015-08-12 DIAGNOSIS — O99213 Obesity complicating pregnancy, third trimester: Secondary | ICD-10-CM | POA: Insufficient documentation

## 2015-08-12 DIAGNOSIS — O3421 Maternal care for scar from previous cesarean delivery: Secondary | ICD-10-CM | POA: Diagnosis not present

## 2015-08-12 DIAGNOSIS — O352XX0 Maternal care for (suspected) hereditary disease in fetus, not applicable or unspecified: Secondary | ICD-10-CM | POA: Diagnosis not present

## 2015-08-12 DIAGNOSIS — Z3A35 35 weeks gestation of pregnancy: Secondary | ICD-10-CM | POA: Diagnosis not present

## 2015-08-15 ENCOUNTER — Ambulatory Visit (HOSPITAL_COMMUNITY)
Admission: RE | Admit: 2015-08-15 | Discharge: 2015-08-15 | Disposition: A | Discharge status: Home / Self Care | Payer: Medicaid Other | Source: Ambulatory Visit | Attending: Obstetrics and Gynecology | Admitting: Obstetrics and Gynecology

## 2015-08-15 ENCOUNTER — Other Ambulatory Visit (HOSPITAL_COMMUNITY): Payer: Self-pay | Admitting: Maternal and Fetal Medicine

## 2015-08-15 ENCOUNTER — Ambulatory Visit (HOSPITAL_COMMUNITY)
Admission: RE | Admit: 2015-08-15 | Discharge: 2015-08-15 | Disposition: A | Discharge status: Home / Self Care | Payer: Medicaid Other | Source: Ambulatory Visit | Attending: Maternal and Fetal Medicine | Admitting: Maternal and Fetal Medicine

## 2015-08-15 ENCOUNTER — Encounter (HOSPITAL_COMMUNITY): Payer: Self-pay

## 2015-08-15 VITALS — BP 121/80 | HR 105 | Wt 331.6 lb

## 2015-08-15 DIAGNOSIS — O09293 Supervision of pregnancy with other poor reproductive or obstetric history, third trimester: Secondary | ICD-10-CM

## 2015-08-15 DIAGNOSIS — Z3A35 35 weeks gestation of pregnancy: Secondary | ICD-10-CM

## 2015-08-15 DIAGNOSIS — O352XX1 Maternal care for (suspected) hereditary disease in fetus, fetus 1: Secondary | ICD-10-CM

## 2015-08-15 DIAGNOSIS — O09299 Supervision of pregnancy with other poor reproductive or obstetric history, unspecified trimester: Secondary | ICD-10-CM

## 2015-08-15 DIAGNOSIS — O24312 Unspecified pre-existing diabetes mellitus in pregnancy, second trimester: Secondary | ICD-10-CM

## 2015-08-15 DIAGNOSIS — O34219 Maternal care for unspecified type scar from previous cesarean delivery: Secondary | ICD-10-CM

## 2015-08-15 DIAGNOSIS — O99213 Obesity complicating pregnancy, third trimester: Secondary | ICD-10-CM

## 2015-08-15 DIAGNOSIS — E669 Obesity, unspecified: Secondary | ICD-10-CM | POA: Diagnosis not present

## 2015-08-15 DIAGNOSIS — O09893 Supervision of other high risk pregnancies, third trimester: Secondary | ICD-10-CM

## 2015-08-15 DIAGNOSIS — E119 Type 2 diabetes mellitus without complications: Secondary | ICD-10-CM

## 2015-08-15 DIAGNOSIS — O09213 Supervision of pregnancy with history of pre-term labor, third trimester: Secondary | ICD-10-CM

## 2015-08-15 DIAGNOSIS — O3421 Maternal care for scar from previous cesarean delivery: Secondary | ICD-10-CM | POA: Diagnosis not present

## 2015-08-19 ENCOUNTER — Ambulatory Visit (HOSPITAL_COMMUNITY)
Admission: RE | Admit: 2015-08-19 | Discharge: 2015-08-19 | Disposition: A | Discharge status: Home / Self Care | Payer: Medicaid Other | Source: Ambulatory Visit | Attending: Obstetrics and Gynecology | Admitting: Obstetrics and Gynecology

## 2015-08-19 DIAGNOSIS — O24312 Unspecified pre-existing diabetes mellitus in pregnancy, second trimester: Secondary | ICD-10-CM | POA: Insufficient documentation

## 2015-08-19 NOTE — ED Notes (Signed)
Pt states blood sugars are being checked at her OB office.

## 2015-08-22 ENCOUNTER — Other Ambulatory Visit: Payer: Self-pay | Admitting: Obstetrics and Gynecology

## 2015-08-22 ENCOUNTER — Ambulatory Visit (HOSPITAL_COMMUNITY)
Admission: RE | Admit: 2015-08-22 | Discharge: 2015-08-22 | Disposition: A | Discharge status: Home / Self Care | Payer: Medicaid Other | Source: Ambulatory Visit | Attending: Obstetrics and Gynecology | Admitting: Obstetrics and Gynecology

## 2015-08-22 ENCOUNTER — Ambulatory Visit (HOSPITAL_COMMUNITY)
Admission: RE | Admit: 2015-08-22 | Discharge: 2015-08-22 | Disposition: A | Discharge status: Home / Self Care | Payer: Medicaid Other | Source: Ambulatory Visit | Attending: Maternal and Fetal Medicine | Admitting: Maternal and Fetal Medicine

## 2015-08-22 ENCOUNTER — Other Ambulatory Visit (HOSPITAL_COMMUNITY): Payer: Self-pay | Admitting: Maternal and Fetal Medicine

## 2015-08-22 ENCOUNTER — Encounter (HOSPITAL_COMMUNITY): Payer: Self-pay | Admitting: *Deleted

## 2015-08-22 ENCOUNTER — Inpatient Hospital Stay (HOSPITAL_COMMUNITY)
Admission: AD | Admit: 2015-08-22 | Discharge: 2015-08-26 | DRG: 765 | Disposition: A | Discharge status: Home / Self Care | Payer: Medicaid Other | Source: Ambulatory Visit | Attending: Obstetrics and Gynecology | Admitting: Obstetrics and Gynecology

## 2015-08-22 DIAGNOSIS — Z8632 Personal history of gestational diabetes: Secondary | ICD-10-CM

## 2015-08-22 DIAGNOSIS — Z880 Allergy status to penicillin: Secondary | ICD-10-CM

## 2015-08-22 DIAGNOSIS — O24425 Gestational diabetes mellitus in childbirth, controlled by oral hypoglycemic drugs: Secondary | ICD-10-CM | POA: Diagnosis present

## 2015-08-22 DIAGNOSIS — Z3A37 37 weeks gestation of pregnancy: Secondary | ICD-10-CM

## 2015-08-22 DIAGNOSIS — O09299 Supervision of pregnancy with other poor reproductive or obstetric history, unspecified trimester: Secondary | ICD-10-CM | POA: Insufficient documentation

## 2015-08-22 DIAGNOSIS — Z833 Family history of diabetes mellitus: Secondary | ICD-10-CM

## 2015-08-22 DIAGNOSIS — Z6841 Body Mass Index (BMI) 40.0 and over, adult: Secondary | ICD-10-CM | POA: Diagnosis not present

## 2015-08-22 DIAGNOSIS — O99214 Obesity complicating childbirth: Secondary | ICD-10-CM | POA: Diagnosis present

## 2015-08-22 DIAGNOSIS — O352XX0 Maternal care for (suspected) hereditary disease in fetus, not applicable or unspecified: Secondary | ICD-10-CM

## 2015-08-22 DIAGNOSIS — O34219 Maternal care for unspecified type scar from previous cesarean delivery: Secondary | ICD-10-CM

## 2015-08-22 DIAGNOSIS — O9081 Anemia of the puerperium: Secondary | ICD-10-CM | POA: Diagnosis not present

## 2015-08-22 DIAGNOSIS — O133 Gestational [pregnancy-induced] hypertension without significant proteinuria, third trimester: Secondary | ICD-10-CM | POA: Diagnosis present

## 2015-08-22 DIAGNOSIS — E119 Type 2 diabetes mellitus without complications: Secondary | ICD-10-CM

## 2015-08-22 DIAGNOSIS — O134 Gestational [pregnancy-induced] hypertension without significant proteinuria, complicating childbirth: Secondary | ICD-10-CM | POA: Diagnosis present

## 2015-08-22 DIAGNOSIS — O99824 Streptococcus B carrier state complicating childbirth: Secondary | ICD-10-CM | POA: Diagnosis present

## 2015-08-22 DIAGNOSIS — Z3A36 36 weeks gestation of pregnancy: Secondary | ICD-10-CM

## 2015-08-22 DIAGNOSIS — Z98891 History of uterine scar from previous surgery: Secondary | ICD-10-CM

## 2015-08-22 DIAGNOSIS — D649 Anemia, unspecified: Secondary | ICD-10-CM | POA: Diagnosis not present

## 2015-08-22 DIAGNOSIS — Z8279 Family history of other congenital malformations, deformations and chromosomal abnormalities: Secondary | ICD-10-CM

## 2015-08-22 DIAGNOSIS — O34211 Maternal care for low transverse scar from previous cesarean delivery: Secondary | ICD-10-CM | POA: Diagnosis present

## 2015-08-22 HISTORY — DX: Other specified postprocedural states: Z98.890

## 2015-08-22 HISTORY — DX: Gestational (pregnancy-induced) hypertension without significant proteinuria, unspecified trimester: O13.9

## 2015-08-22 LAB — COMPREHENSIVE METABOLIC PANEL
ALK PHOS: 129 U/L — AB (ref 38–126)
ALT: 24 U/L (ref 14–54)
AST: 21 U/L (ref 15–41)
Albumin: 2.8 g/dL — ABNORMAL LOW (ref 3.5–5.0)
Anion gap: 6 (ref 5–15)
BILIRUBIN TOTAL: 0.3 mg/dL (ref 0.3–1.2)
BUN: 9 mg/dL (ref 6–20)
CALCIUM: 8.8 mg/dL — AB (ref 8.9–10.3)
CHLORIDE: 105 mmol/L (ref 101–111)
CO2: 22 mmol/L (ref 22–32)
CREATININE: 0.49 mg/dL (ref 0.44–1.00)
Glucose, Bld: 145 mg/dL — ABNORMAL HIGH (ref 65–99)
Potassium: 3.8 mmol/L (ref 3.5–5.1)
Sodium: 133 mmol/L — ABNORMAL LOW (ref 135–145)
TOTAL PROTEIN: 6.4 g/dL — AB (ref 6.5–8.1)

## 2015-08-22 LAB — PROTEIN / CREATININE RATIO, URINE
CREATININE, URINE: 83 mg/dL
Protein Creatinine Ratio: 0.12 mg/mg{Cre} (ref 0.00–0.15)
Total Protein, Urine: 10 mg/dL

## 2015-08-22 LAB — GLUCOSE, CAPILLARY
GLUCOSE-CAPILLARY: 126 mg/dL — AB (ref 65–99)
Glucose-Capillary: 103 mg/dL — ABNORMAL HIGH (ref 65–99)

## 2015-08-22 LAB — CBC
HCT: 33.3 % — ABNORMAL LOW (ref 36.0–46.0)
Hemoglobin: 10.9 g/dL — ABNORMAL LOW (ref 12.0–15.0)
MCH: 24.7 pg — AB (ref 26.0–34.0)
MCHC: 32.7 g/dL (ref 30.0–36.0)
MCV: 75.3 fL — AB (ref 78.0–100.0)
PLATELETS: 274 10*3/uL (ref 150–400)
RBC: 4.42 MIL/uL (ref 3.87–5.11)
RDW: 17.1 % — ABNORMAL HIGH (ref 11.5–15.5)
WBC: 6.8 10*3/uL (ref 4.0–10.5)

## 2015-08-22 LAB — TYPE AND SCREEN
ABO/RH(D): O POS
ANTIBODY SCREEN: NEGATIVE

## 2015-08-22 LAB — URIC ACID: Uric Acid, Serum: 3.8 mg/dL (ref 2.3–6.6)

## 2015-08-22 LAB — LACTATE DEHYDROGENASE: LDH: 142 U/L (ref 98–192)

## 2015-08-22 MED ORDER — ACETAMINOPHEN 325 MG PO TABS: 650.0000 mg | ORAL_TABLET | ORAL | Status: DC | PRN

## 2015-08-22 MED ORDER — CALCIUM CARBONATE ANTACID 500 MG PO CHEW
2.0000 | CHEWABLE_TABLET | ORAL | Status: DC | PRN
  Filled 2015-08-22: qty 2

## 2015-08-22 MED ORDER — LACTATED RINGERS IV SOLN
INTRAVENOUS | Status: DC
  Administered 2015-08-22 – 2015-08-23 (×2): via INTRAVENOUS

## 2015-08-22 MED ORDER — ZOLPIDEM TARTRATE 5 MG PO TABS: 5.0000 mg | ORAL_TABLET | Freq: Every evening | ORAL | Status: DC | PRN

## 2015-08-22 MED ORDER — GLYBURIDE 5 MG PO TABS
5.0000 mg | ORAL_TABLET | Freq: Two times a day (BID) | ORAL | Status: DC
  Administered 2015-08-22 – 2015-08-23 (×2): 5 mg via ORAL
  Filled 2015-08-22 (×4): qty 1

## 2015-08-22 MED ORDER — PRENATAL MULTIVITAMIN CH
1.0000 | ORAL_TABLET | Freq: Every day | ORAL | Status: DC
  Administered 2015-08-22: 1 via ORAL
  Filled 2015-08-22 (×3): qty 1

## 2015-08-22 MED ORDER — DOCUSATE SODIUM 100 MG PO CAPS
100.0000 mg | ORAL_CAPSULE | Freq: Every day | ORAL | Status: DC
  Administered 2015-08-22 – 2015-08-23 (×2): 100 mg via ORAL
  Filled 2015-08-22 (×4): qty 1

## 2015-08-22 NOTE — MAU Note (Signed)
Report called to T. [place] [facility], [facility] regarding pt admission to Antenatal

## 2015-08-22 NOTE — MAU Note (Addendum)
Pt sent from MFM for pre-eclampsia eval.  Diastolic pressure up.   No pain, bleeding or leaking. Denies HA, visual changes, changes in swelling or epigastric pain

## 2015-08-22 NOTE — H&P (Addendum)
[name] is a 29 y.o. femaleG2 P0100 @ 69 6/7 weeks with GDM on Glyburide 5 mg BID sent from MFM due to elevated BP.  Pt has had twice weekly testing at [facility]FM for GDM since 32 weeks.  Today NST was reactive, AFI normal but her diastolic BP was 90.  Pt has denied symptoms of headaches, visual changes, abdominal pain. Pt was sent to MAU.  Bps were mildly elevated, 140/95 x 2, 150/94, 144/97.  Preeclampsia labs normal, pr/cr ratio 0.12. Pt has a h/o child with Trisomy 21, delivered by cesarean section at 32 weeks due to fetal distress.  Pt was initially interested in VBAC but recent ultrasound showed EFW >90th%.  Pt desired a repeat cesarean section at 39 weeks, but would consider VBAC if she went in to labor preterm.  Maternal Medical History:  Reason for admission: Elevated BP.  Fetal activity: Perceived fetal activity is normal.    Prenatal Complications - Diabetes: gestational. Diabetes is managed by oral agent (monotherapy).      OB History    Gravida Para Term Preterm AB TAB SAB Ectopic Multiple Living   2 1  1       0     Past Medical History  Diagnosis Date  . Abnormal Pap smear 2011  . Sleep apnea 2004  . Anemia   . Fibroid   . Gestational diabetes     on insulin   Past Surgical History  Procedure Laterality Date  . Cyst on foot    . Cesarean section N/A 04/03/2013    Procedure: CESAREAN SECTION;  Surgeon: Thurnell Lose, MD;  Location: [facility];  Service: Obstetrics;  Laterality: N/A;   Family History: family history includes Arthritis in her father and maternal grandmother; Asthma in her brother; COPD in her maternal grandfather; Cancer in her maternal grandfather; Diabetes in her father, maternal grandmother, and maternal uncle; Down syndrome in her daughter; Hypertension in her brother and mother. Social History:  reports that she has never smoked. She has never used smokeless tobacco. She reports that she does not drink alcohol or use illicit drugs.   Prenatal  Transfer Tool  Maternal Diabetes: Yes:  Diabetes Type:  Insulin/Medication controlled Genetic Screening: Abnormal:  Results: Other:  Unable to get Panaroma x 2, Pt declined further testing Maternal Ultrasounds/Referrals: Normal Fetal Ultrasounds or other Referrals:  Referred to Materal Fetal Medicine  Maternal Substance Abuse:  No Significant Maternal Medications:  Meds include: Other:  Glyburide 5 mg BID Significant Maternal Lab Results:  Lab values include: Group B Strep positive Other Comments:  H/o previous child with T21, demise at 1 year  Review of Systems  Eyes:       No visual changes.  Gastrointestinal: Negative for abdominal pain.  Neurological: Negative for headaches.      Blood pressure 144/97, pulse 111, temperature 98.6 F (37 C), temperature source Oral, resp. rate 18, last menstrual period 12/07/2014, unknown if currently breastfeeding. Maternal Exam:  Abdomen: Patient reports no abdominal tenderness. Fundal height is N/a due to habitus.   Estimated fetal weight is 7 1/2 pounds.   Fetal presentation: vertex  Introitus: Normal vulva. Pelvis: adequate for delivery.   Cervix: Cervix evaluated by digital exam.     Fetal Exam Fetal Monitor Review: Mode: fetoscope.   Baseline rate: 140s.   Fetal State Assessment: Category I - tracings are normal.     Physical Exam  Constitutional: She is oriented to person, place, and time. She appears well-developed and well-nourished. No  distress.  HENT:  Head: Normocephalic and atraumatic.  Eyes: EOM are normal.  Neck: Normal range of motion.  Cardiovascular: Normal rate, regular rhythm and normal heart sounds.   Respiratory: Effort normal and breath sounds normal. No respiratory distress.  GI: There is no tenderness.  No RUQ tenderness. Large edematous pannus.  Musculoskeletal: Normal range of motion. She exhibits edema. She exhibits no tenderness.  Neurological: She is alert and oriented to person, place, and time. She  has normal reflexes.  DTR 2+  Skin: Skin is warm and dry.  Psychiatric: She has a normal mood and affect. Her behavior is normal. Judgment and thought content normal.    Prenatal labs: ABO, Rh:   Antibody:   Rubella:   RPR:    HBsAg:    HIV:    GBS:   Positive, resistant to Clindamycin  Assessment/Plan: IUP @ 36 6/7 weeks, Gestational HTN Gestational Diabetes, A2 well controlled. Morbid Obesity, BMI 52 Unfavorable cervix H/o Previous cesarean section. GBS+ PCN allergy, resistant to PCN.    Discussed with MFM, Dr. Lawerance Cruel.  Recommends delivery at 37 weeks due to Gestational HTN.    Pt will be admitted to Antepartum unit.  NST q shift.  Bedrest with Bathroom privileges. Continue previous Glyburide. CBG fasting and 2 hour postprandial breakfast, lunch, dinner. Pt will consider TOLAC vs. Repeat cesarean section.  Luian Schumpert 08/22/2015, 1:13 PM  Addendum; Discussed with pt unfavorable cervix.  Options are foley bulb which likely can not be placed due to closed cervix or low dose Pitocin.  Prolonged Pitocin could increase risk of uterine rupture. Initially considered ordering ultrasound to determine fetal weight but after considering risk of rupture and prolonged induction, pt and I agree to proceed with repeat cesarean section. Pt counseled on risk of infection, wound breakdown, which would require healing by secondary intention, due large pannus.  Also informed of risk of adhesions, injury to abdominal organs.  Pt accepts risk of cesarean section.  Consent signed.

## 2015-08-22 NOTE — ED Notes (Signed)
Dr. Simona Huh notified of pt's diastolics in the 19'I.  Pt to go to MAU for evaluation.

## 2015-08-23 ENCOUNTER — Encounter (HOSPITAL_COMMUNITY): Payer: Self-pay | Admitting: Anesthesiology

## 2015-08-23 ENCOUNTER — Inpatient Hospital Stay (HOSPITAL_COMMUNITY): Payer: Medicaid Other | Admitting: Anesthesiology

## 2015-08-23 ENCOUNTER — Encounter (HOSPITAL_COMMUNITY)
Admission: AD | Disposition: A | Discharge status: Home / Self Care | Payer: Self-pay | Source: Ambulatory Visit | Attending: Obstetrics and Gynecology

## 2015-08-23 HISTORY — PX: Z-PLASTY SCAR REVISION: SHX6192

## 2015-08-23 LAB — CBC
HEMATOCRIT: 34 % — AB (ref 36.0–46.0)
Hemoglobin: 11 g/dL — ABNORMAL LOW (ref 12.0–15.0)
MCH: 24.3 pg — AB (ref 26.0–34.0)
MCHC: 32.4 g/dL (ref 30.0–36.0)
MCV: 75.2 fL — AB (ref 78.0–100.0)
PLATELETS: 258 10*3/uL (ref 150–400)
RBC: 4.52 MIL/uL (ref 3.87–5.11)
RDW: 17.2 % — AB (ref 11.5–15.5)
WBC: 5.8 10*3/uL (ref 4.0–10.5)

## 2015-08-23 LAB — GLUCOSE, CAPILLARY
GLUCOSE-CAPILLARY: 79 mg/dL (ref 65–99)
Glucose-Capillary: 79 mg/dL (ref 65–99)
Glucose-Capillary: 62 mg/dL — ABNORMAL LOW (ref 65–99)
Glucose-Capillary: 93 mg/dL (ref 65–99)

## 2015-08-23 SURGERY — Surgical Case
Anesthesia: Epidural

## 2015-08-23 MED ORDER — DIBUCAINE 1 % RE OINT: 1.0000 "application " | TOPICAL_OINTMENT | RECTAL | Status: DC | PRN

## 2015-08-23 MED ORDER — CITRIC ACID-SODIUM CITRATE 334-500 MG/5ML PO SOLN
30.0000 mL | Freq: Once | ORAL | Status: AC
  Administered 2015-08-23: 30 mL via ORAL
  Filled 2015-08-23: qty 15

## 2015-08-23 MED ORDER — IBUPROFEN 600 MG PO TABS
600.0000 mg | ORAL_TABLET | Freq: Four times a day (QID) | ORAL | Status: DC
Start: 2015-08-23 — End: 2015-08-26
  Administered 2015-08-24 – 2015-08-26 (×10): 600 mg via ORAL
  Filled 2015-08-23 (×10): qty 1

## 2015-08-23 MED ORDER — KETOROLAC TROMETHAMINE 30 MG/ML IJ SOLN: 30.0000 mg | Freq: Four times a day (QID) | INTRAMUSCULAR | Status: AC | PRN

## 2015-08-23 MED ORDER — DEXTROSE 50 % IV SOLN
INTRAVENOUS | Status: DC | PRN
  Administered 2015-08-23: 25 mL via INTRAVENOUS

## 2015-08-23 MED ORDER — DIPHENHYDRAMINE HCL 25 MG PO CAPS: 25.0000 mg | ORAL_CAPSULE | ORAL | Status: DC | PRN

## 2015-08-23 MED ORDER — MEPERIDINE HCL 25 MG/ML IJ SOLN: 6.2500 mg | INTRAMUSCULAR | Status: DC | PRN

## 2015-08-23 MED ORDER — OXYTOCIN 40 UNITS IN LACTATED RINGERS INFUSION - SIMPLE MED: 62.5000 mL/h | INTRAVENOUS | Status: AC

## 2015-08-23 MED ORDER — NALBUPHINE HCL 10 MG/ML IJ SOLN: 5.0000 mg | Freq: Once | INTRAMUSCULAR | Status: DC | PRN

## 2015-08-23 MED ORDER — LACTATED RINGERS IV SOLN
INTRAVENOUS | Status: DC | PRN
  Administered 2015-08-23: 14:00:00 via INTRAVENOUS

## 2015-08-23 MED ORDER — OXYTOCIN 10 UNIT/ML IJ SOLN
INTRAMUSCULAR | Status: AC
  Filled 2015-08-23: qty 4

## 2015-08-23 MED ORDER — ZOLPIDEM TARTRATE 5 MG PO TABS: 5.0000 mg | ORAL_TABLET | Freq: Every evening | ORAL | Status: DC | PRN

## 2015-08-23 MED ORDER — PRENATAL MULTIVITAMIN CH
1.0000 | ORAL_TABLET | Freq: Every day | ORAL | Status: DC
  Administered 2015-08-24 – 2015-08-25 (×2): 1 via ORAL
  Filled 2015-08-23 (×2): qty 1

## 2015-08-23 MED ORDER — TETANUS-DIPHTH-ACELL PERTUSSIS 5-2.5-18.5 LF-MCG/0.5 IM SUSP: 0.5000 mL | Freq: Once | INTRAMUSCULAR | Status: DC

## 2015-08-23 MED ORDER — SODIUM CHLORIDE 0.9 % IJ SOLN: 3.0000 mL | INTRAMUSCULAR | Status: DC | PRN

## 2015-08-23 MED ORDER — DIPHENHYDRAMINE HCL 25 MG PO CAPS: 25.0000 mg | ORAL_CAPSULE | Freq: Four times a day (QID) | ORAL | Status: DC | PRN

## 2015-08-23 MED ORDER — ONDANSETRON HCL 4 MG/2ML IJ SOLN
INTRAMUSCULAR | Status: AC
  Filled 2015-08-23: qty 2

## 2015-08-23 MED ORDER — NALOXONE HCL 0.4 MG/ML IJ SOLN: 0.4000 mg | INTRAMUSCULAR | Status: DC | PRN

## 2015-08-23 MED ORDER — MISOPROSTOL 200 MCG PO TABS
800.0000 ug | ORAL_TABLET | Freq: Once | ORAL | Status: DC | PRN
Start: 2015-08-23 — End: 2015-08-26

## 2015-08-23 MED ORDER — FENTANYL CITRATE (PF) 100 MCG/2ML IJ SOLN
INTRAMUSCULAR | Status: DC | PRN
  Administered 2015-08-23: 10 ug via INTRATHECAL
  Administered 2015-08-23: 50 ug via INTRAVENOUS

## 2015-08-23 MED ORDER — SIMETHICONE 80 MG PO CHEW
80.0000 mg | CHEWABLE_TABLET | Freq: Three times a day (TID) | ORAL | Status: DC
  Administered 2015-08-24 – 2015-08-26 (×6): 80 mg via ORAL
  Filled 2015-08-23 (×7): qty 1

## 2015-08-23 MED ORDER — LACTATED RINGERS IV SOLN: INTRAVENOUS | Status: DC | PRN

## 2015-08-23 MED ORDER — SIMETHICONE 80 MG PO CHEW
80.0000 mg | CHEWABLE_TABLET | ORAL | Status: DC
  Administered 2015-08-24 – 2015-08-25 (×3): 80 mg via ORAL
  Filled 2015-08-23 (×3): qty 1

## 2015-08-23 MED ORDER — MENTHOL 3 MG MT LOZG
1.0000 | LOZENGE | OROMUCOSAL | Status: DC | PRN
Start: 2015-08-23 — End: 2015-08-26

## 2015-08-23 MED ORDER — DEXTROSE 5 % IV SOLN: INTRAVENOUS | Status: DC

## 2015-08-23 MED ORDER — KETOROLAC TROMETHAMINE 30 MG/ML IJ SOLN
INTRAMUSCULAR | Status: AC
  Administered 2015-08-23: 30 mg
  Filled 2015-08-23: qty 1

## 2015-08-23 MED ORDER — ACETAMINOPHEN 325 MG PO TABS: 650.0000 mg | ORAL_TABLET | ORAL | Status: DC | PRN

## 2015-08-23 MED ORDER — DEXTROSE 5 % IV SOLN
3.0000 g | INTRAVENOUS | Status: AC
  Administered 2015-08-23: 3 g via INTRAVENOUS
  Filled 2015-08-23: qty 3000

## 2015-08-23 MED ORDER — LANOLIN HYDROUS EX OINT: 1.0000 "application " | TOPICAL_OINTMENT | CUTANEOUS | Status: DC | PRN

## 2015-08-23 MED ORDER — DIPHENHYDRAMINE HCL 50 MG/ML IJ SOLN: 12.5000 mg | INTRAMUSCULAR | Status: DC | PRN

## 2015-08-23 MED ORDER — PHENYLEPHRINE 8 MG IN D5W 100 ML (0.08MG/ML) PREMIX OPTIME
INJECTION | INTRAVENOUS | Status: AC
  Filled 2015-08-23: qty 100

## 2015-08-23 MED ORDER — ONDANSETRON HCL 4 MG/2ML IJ SOLN: 4.0000 mg | Freq: Three times a day (TID) | INTRAMUSCULAR | Status: DC | PRN

## 2015-08-23 MED ORDER — SCOPOLAMINE 1 MG/3DAYS TD PT72
1.0000 | MEDICATED_PATCH | Freq: Once | TRANSDERMAL | Status: DC
  Filled 2015-08-23: qty 1

## 2015-08-23 MED ORDER — NALBUPHINE HCL 10 MG/ML IJ SOLN: 5.0000 mg | INTRAMUSCULAR | Status: DC | PRN

## 2015-08-23 MED ORDER — FENTANYL CITRATE (PF) 100 MCG/2ML IJ SOLN: 25.0000 ug | INTRAMUSCULAR | Status: DC | PRN

## 2015-08-23 MED ORDER — SODIUM CHLORIDE 0.9 % IJ SOLN
INTRAMUSCULAR | Status: AC
  Filled 2015-08-23: qty 10

## 2015-08-23 MED ORDER — BUPIVACAINE IN DEXTROSE 0.75-8.25 % IT SOLN
INTRATHECAL | Status: DC | PRN
  Administered 2015-08-23: 1.6 mL via INTRATHECAL

## 2015-08-23 MED ORDER — MORPHINE SULFATE (PF) 0.5 MG/ML IJ SOLN
INTRAMUSCULAR | Status: DC | PRN
  Administered 2015-08-23: .2 mg via INTRATHECAL

## 2015-08-23 MED ORDER — EPHEDRINE SULFATE 50 MG/ML IJ SOLN
INTRAMUSCULAR | Status: DC | PRN
  Administered 2015-08-23 (×2): 10 mg via INTRAVENOUS

## 2015-08-23 MED ORDER — WITCH HAZEL-GLYCERIN EX PADS: 1.0000 "application " | MEDICATED_PAD | CUTANEOUS | Status: DC | PRN

## 2015-08-23 MED ORDER — OXYTOCIN 10 UNIT/ML IJ SOLN
40.0000 [IU] | INTRAVENOUS | Status: DC | PRN
  Administered 2015-08-23: 40 [IU] via INTRAVENOUS

## 2015-08-23 MED ORDER — MISOPROSTOL 200 MCG PO TABS
ORAL_TABLET | ORAL | Status: AC
  Filled 2015-08-23: qty 4

## 2015-08-23 MED ORDER — SENNOSIDES-DOCUSATE SODIUM 8.6-50 MG PO TABS
2.0000 | ORAL_TABLET | ORAL | Status: DC
  Administered 2015-08-24 – 2015-08-25 (×2): 2 via ORAL
  Filled 2015-08-23 (×2): qty 2

## 2015-08-23 MED ORDER — ONDANSETRON HCL 4 MG/2ML IJ SOLN: 4.0000 mg | Freq: Once | INTRAMUSCULAR | Status: DC | PRN

## 2015-08-23 MED ORDER — SODIUM CHLORIDE 0.9 % IR SOLN
Status: DC | PRN
  Administered 2015-08-23: 1000 mL

## 2015-08-23 MED ORDER — FENTANYL CITRATE (PF) 100 MCG/2ML IJ SOLN
INTRAMUSCULAR | Status: AC
  Filled 2015-08-23: qty 4

## 2015-08-23 MED ORDER — PHENYLEPHRINE 8 MG IN D5W 100 ML (0.08MG/ML) PREMIX OPTIME
INJECTION | INTRAVENOUS | Status: DC | PRN
  Administered 2015-08-23: 30 ug/min via INTRAVENOUS

## 2015-08-23 MED ORDER — NALOXONE HCL 1 MG/ML IJ SOLN: 1.0000 ug/kg/h | INTRAMUSCULAR | Status: DC | PRN

## 2015-08-23 MED ORDER — ONDANSETRON HCL 4 MG/2ML IJ SOLN
INTRAMUSCULAR | Status: DC | PRN
Start: 2015-08-23 — End: 2015-08-23
  Administered 2015-08-23: 4 mg via INTRAVENOUS

## 2015-08-23 MED ORDER — LACTATED RINGERS IV SOLN
INTRAVENOUS | Status: DC
  Administered 2015-08-24: 01:00:00 via INTRAVENOUS

## 2015-08-23 MED ORDER — OXYCODONE-ACETAMINOPHEN 5-325 MG PO TABS
2.0000 | ORAL_TABLET | ORAL | Status: DC | PRN
  Administered 2015-08-25 – 2015-08-26 (×3): 2 via ORAL
  Filled 2015-08-23 (×3): qty 2

## 2015-08-23 MED ORDER — OXYCODONE-ACETAMINOPHEN 5-325 MG PO TABS
1.0000 | ORAL_TABLET | ORAL | Status: DC | PRN
  Administered 2015-08-24 – 2015-08-26 (×5): 1 via ORAL
  Filled 2015-08-23 (×6): qty 1

## 2015-08-23 MED ORDER — MORPHINE SULFATE (PF) 0.5 MG/ML IJ SOLN
INTRAMUSCULAR | Status: AC
  Filled 2015-08-23: qty 100

## 2015-08-23 MED ORDER — SIMETHICONE 80 MG PO CHEW
80.0000 mg | CHEWABLE_TABLET | ORAL | Status: DC | PRN
  Administered 2015-08-25: 80 mg via ORAL

## 2015-08-23 MED ORDER — SCOPOLAMINE 1 MG/3DAYS TD PT72
MEDICATED_PATCH | TRANSDERMAL | Status: AC
  Administered 2015-08-23: 1.5 mg
  Filled 2015-08-23: qty 1

## 2015-08-23 SURGICAL SUPPLY — 41 items
BARRIER ADHS 3X4 INTERCEED (GAUZE/BANDAGES/DRESSINGS) ×3 IMPLANT
BENZOIN TINCTURE PRP APPL 2/3 (GAUZE/BANDAGES/DRESSINGS) ×3 IMPLANT
CLAMP CORD UMBIL (MISCELLANEOUS) IMPLANT
CLOTH BEACON ORANGE TIMEOUT ST (SAFETY) ×3 IMPLANT
DRAPE SHEET LG 3/4 BI-LAMINATE (DRAPES) IMPLANT
DRESSING DISP NPWT PICO 4X12 (MISCELLANEOUS) ×3 IMPLANT
DRSG OPSITE POSTOP 4X10 (GAUZE/BANDAGES/DRESSINGS) ×3 IMPLANT
DURAPREP 26ML APPLICATOR (WOUND CARE) ×3 IMPLANT
ELECT REM PT RETURN 9FT ADLT (ELECTROSURGICAL) ×3
ELECTRODE REM PT RTRN 9FT ADLT (ELECTROSURGICAL) ×2 IMPLANT
EXTENDER TRAXI PANNICULUS (MISCELLANEOUS) ×2 IMPLANT
EXTRACTOR VACUUM BELL STYLE (SUCTIONS) ×3 IMPLANT
GLOVE BIO SURGEON STRL SZ7 (GLOVE) ×3 IMPLANT
GLOVE BIOGEL PI IND STRL 7.0 (GLOVE) ×2 IMPLANT
GLOVE BIOGEL PI INDICATOR 7.0 (GLOVE) ×1
GOWN STRL REUS W/TWL LRG LVL3 (GOWN DISPOSABLE) ×6 IMPLANT
KIT ABG SYR 3ML LUER SLIP (SYRINGE) IMPLANT
LIQUID BAND (GAUZE/BANDAGES/DRESSINGS) IMPLANT
NEEDLE HYPO 25X5/8 SAFETYGLIDE (NEEDLE) IMPLANT
NS IRRIG 1000ML POUR BTL (IV SOLUTION) ×3 IMPLANT
PACK C SECTION WH (CUSTOM PROCEDURE TRAY) ×3 IMPLANT
PAD OB MATERNITY 4.3X12.25 (PERSONAL CARE ITEMS) ×3 IMPLANT
PENCIL SMOKE EVAC W/HOLSTER (ELECTROSURGICAL) ×3 IMPLANT
RETRACTOR TRAXI PANNICULUS (MISCELLANEOUS) ×2 IMPLANT
RTRCTR C-SECT PINK 25CM LRG (MISCELLANEOUS) ×3 IMPLANT
STRIP CLOSURE SKIN 1/2X4 (GAUZE/BANDAGES/DRESSINGS) ×3 IMPLANT
SUT CHROMIC 0 CTX 36 (SUTURE) IMPLANT
SUT MON AB 4-0 PS1 27 (SUTURE) ×3 IMPLANT
SUT PLAIN 2 0 (SUTURE)
SUT PLAIN 2 0 XLH (SUTURE) ×3 IMPLANT
SUT PLAIN ABS 2-0 54XMFL TIE (SUTURE) IMPLANT
SUT VIC AB 0 CT1 27 (SUTURE) ×2
SUT VIC AB 0 CT1 27XBRD ANBCTR (SUTURE) ×4 IMPLANT
SUT VIC AB 0 CTX 36 (SUTURE) ×3
SUT VIC AB 0 CTX36XBRD ANBCTRL (SUTURE) ×6 IMPLANT
SUT VIC AB 2-0 CT1 27 (SUTURE) ×1
SUT VIC AB 2-0 CT1 TAPERPNT 27 (SUTURE) ×2 IMPLANT
TOWEL OR 17X24 6PK STRL BLUE (TOWEL DISPOSABLE) ×3 IMPLANT
TRAXI PANNICULUS EXTENDER (MISCELLANEOUS) ×1
TRAXI PANNICULUS RETRACTOR (MISCELLANEOUS) ×1
TRAY FOLEY CATH SILVER 14FR (SET/KITS/TRAYS/PACK) IMPLANT

## 2015-08-23 NOTE — Anesthesia Postprocedure Evaluation (Signed)
  Anesthesia Post-op Note  Patient: [name]  Procedure(s) Performed: Procedure(s) (LRB): CESAREAN SECTION (N/A) Z-PLASTY SCAR REVISION  Patient Location: PACU  Anesthesia Type: Spinal  Level of Consciousness: awake and alert   Airway and Oxygen Therapy: Patient Spontanous Breathing  Post-op Pain: mild  Post-op Assessment: Post-op Vital signs reviewed, Patient's Cardiovascular Status Stable, Respiratory Function Stable, Patent Airway and No signs of Nausea or vomiting  Last Vitals:  Filed Vitals:   08/23/15 1600  BP: 130/81  Pulse: 81  Temp:   Resp: 21    Post-op Vital Signs: stable   Complications: No apparent anesthesia complications

## 2015-08-23 NOTE — Transfer of Care (Signed)
Immediate Anesthesia Transfer of Care Note  Patient: [name]  Procedure(s) Performed: Procedure(s): CESAREAN SECTION (N/A)  Patient Location: PACU  Anesthesia Type:Spinal  Level of Consciousness: awake and alert   Airway & Oxygen Therapy: Patient Spontanous Breathing  Post-op Assessment: Report given to RN and Post -op Vital signs reviewed and stable  Post vital signs: Reviewed and stable  Last Vitals:  Filed Vitals:   08/23/15 1228  BP: 136/92  Pulse: 97  Temp:   Resp:     Complications: No apparent anesthesia complications

## 2015-08-23 NOTE — Progress Notes (Signed)
Hospital day # 1 pregnancy at [redacted]w[redacted]d--GHTN and GDM   S:  Doing well--had mild HA on awakening,      Denies visual changes, epigastric pain, SOB, or chest pain.        Perception of contractions: none      Vaginal bleeding: none now       Vaginal discharge:  no significant change   O: BP 131/77 mmHg  Pulse 91  Temp(Src) 98.9 F (37.2 C) (Oral)  Resp 18  Ht 5\' 7"  (1.702 m)  Wt 332 lb 8 oz (150.821 kg)  BMI 52.06 kg/m2  SpO2 100%  LMP 12/07/2014        Filed Vitals:   08/22/15 2343 08/23/15 0421 08/23/15 0526 08/23/15 0652  BP:  111/58  131/77  Pulse:  113  91  Temp: 98.8 F (37.1 C) 98.9 F (37.2 C)    TempSrc: Oral Oral    Resp: 20 18  18   Height:      Weight:   332 lb 8 oz (150.821 kg)   SpO2:  96%  100%      CBG Fasting 79, 2hr PP 79       FHR: Catagory  1      CTXs: occasional      Uterus gravid and non-tender       Extremities: no significant edema and no signs of DVT      Abd: soft, non-tender, +BS, no rebound, no guarding       Respiratory: CTAB. Effort normal.                @LABS @       Results for orders placed or performed during the hospital encounter of 08/22/15 (from the past 24 hour(s))  Protein / creatinine ratio, urine     Status: None   Collection Time: 08/22/15 11:30 AM  Result Value Ref Range   Creatinine, Urine 83.00 mg/dL   Total Protein, Urine 10 mg/dL   Protein Creatinine Ratio 0.12 0.00 - 0.15 mg/mg[Cre]  CBC     Status: Abnormal   Collection Time: 08/22/15 11:50 AM  Result Value Ref Range   WBC 6.8 4.0 - 10.5 K/uL   RBC 4.42 3.87 - 5.11 MIL/uL   Hemoglobin 10.9 (L) 12.0 - 15.0 g/dL   HCT 33.3 (L) 36.0 - 46.0 %   MCV 75.3 (L) 78.0 - 100.0 fL   MCH 24.7 (L) 26.0 - 34.0 pg   MCHC 32.7 30.0 - 36.0 g/dL   RDW 17.1 (H) 11.5 - 15.5 %   Platelets 274 150 - 400 K/uL  Comprehensive metabolic panel     Status: Abnormal   Collection Time: 08/22/15 11:50 AM  Result Value Ref Range   Sodium 133 (L) 135 - 145 mmol/L   Potassium 3.8 3.5 - 5.1  mmol/L   Chloride 105 101 - 111 mmol/L   CO2 22 22 - 32 mmol/L   Glucose, Bld 145 (H) 65 - 99 mg/dL   BUN 9 6 - 20 mg/dL   Creatinine, Ser 0.49 0.44 - 1.00 mg/dL   Calcium 8.8 (L) 8.9 - 10.3 mg/dL   Total Protein 6.4 (L) 6.5 - 8.1 g/dL   Albumin 2.8 (L) 3.5 - 5.0 g/dL   AST 21 15 - 41 U/L   ALT 24 14 - 54 U/L   Alkaline Phosphatase 129 (H) 38 - 126 U/L   Total Bilirubin 0.3 0.3 - 1.2 mg/dL   GFR calc non Af Amer >60 >60 mL/min  GFR calc Af Amer >60 >60 mL/min   Anion gap 6 5 - 15  Lactate dehydrogenase     Status: None   Collection Time: 08/22/15 11:50 AM  Result Value Ref Range   LDH 142 98 - 192 U/L  Uric acid     Status: None   Collection Time: 08/22/15 11:50 AM  Result Value Ref Range   Uric Acid, Serum 3.8 2.3 - 6.6 mg/dL  Type and screen     Status: None   Collection Time: 08/22/15 12:53 PM  Result Value Ref Range   ABO/RH(D) O POS    Antibody Screen NEG    Sample Expiration 08/25/2015   Glucose, capillary     Status: Abnormal   Collection Time: 08/22/15  5:41 PM  Result Value Ref Range   Glucose-Capillary 103 (H) 65 - 99 mg/dL  Glucose, capillary     Status: Abnormal   Collection Time: 08/22/15  9:43 PM  Result Value Ref Range   Glucose-Capillary 126 (H) 65 - 99 mg/dL  Glucose, capillary     Status: None   Collection Time: 08/23/15  7:43 AM  Result Value Ref Range   Glucose-Capillary 79 65 - 99 mg/dL   Comment 1 Notify RN   Glucose, capillary     Status: None   Collection Time: 08/23/15 10:43 AM  Result Value Ref Range   Glucose-Capillary 79 65 - 99 mg/dL   Comment 1 Notify RN         Meds:  Current facility-administered medications:  .  acetaminophen (TYLENOL) tablet 650 mg, 650 mg, Oral, Q4H PRN, Thurnell Lose, MD .  calcium carbonate (TUMS - dosed in mg elemental calcium) chewable tablet 400 mg of elemental calcium, 2 tablet, Oral, Q4H PRN, Thurnell Lose, MD .  docusate sodium (COLACE) capsule 100 mg, 100 mg, Oral, Daily, Thurnell Lose, MD, 100 mg at  08/23/15 0753 .  glyBURIDE (DIABETA) tablet 5 mg, 5 mg, Oral, BID AC & HS, Thurnell Lose, MD, 5 mg at 08/23/15 0753 .  lactated ringers infusion, , Intravenous, Continuous, Thurnell Lose, MD, Last Rate: 20 mL/hr at 08/22/15 1910 .  prenatal multivitamin tablet 1 tablet, 1 tablet, Oral, Q1200, Thurnell Lose, MD, 1 tablet at 08/22/15 1848 .  zolpidem (AMBIEN) tablet 5 mg, 5 mg, Oral, QHS PRN, Thurnell Lose, MD   A: [redacted]w[redacted]d with GHTN and GDM     stable     Meds: glyburide      GBS: unknown   P: Prepare pt for repeat CS per Dr Suzzette Righter      Upcoming tests/treatments:  CS today [phone]      Plan reviewed with patient and husband      HTN parameters      Consulted with Dr. Alesia Richards      MDs will follow   Quinita Kostelecky, CNM, MSN 08/23/2015. 10:46 AM

## 2015-08-23 NOTE — Progress Notes (Signed)
Pt reminded she is to have nothing to eat or drink from this point.  Pt verbalized understanding & agreeable.

## 2015-08-23 NOTE — Anesthesia Preprocedure Evaluation (Signed)
Anesthesia Evaluation  Patient identified by MRN, date of birth, ID band Patient awake    Reviewed: Allergy & Precautions, NPO status , Patient's Chart, lab work & pertinent test results  History of Anesthesia Complications Negative for: history of anesthetic complications  Airway Mallampati: II  TM Distance: >3 FB Neck ROM: Full    Dental no notable dental hx. (+) Dental Advisory Given   Pulmonary neg pulmonary ROS,    Pulmonary exam normal breath sounds clear to auscultation       Cardiovascular hypertension (gHTN), Normal cardiovascular exam Rhythm:Regular Rate:Normal     Neuro/Psych negative neurological ROS  negative psych ROS   GI/Hepatic negative GI ROS, Neg liver ROS,   Endo/Other  diabetes, GestationalMorbid obesity  Renal/GU negative Renal ROS  negative genitourinary   Musculoskeletal negative musculoskeletal ROS (+)   Abdominal   Peds negative pediatric ROS (+)  Hematology negative hematology ROS (+)   Anesthesia Other Findings   Reproductive/Obstetrics (+) Pregnancy                             Anesthesia Physical Anesthesia Plan  ASA: III  Anesthesia Plan: Epidural   Post-op Pain Management:    Induction:   Airway Management Planned:   Additional Equipment:   Intra-op Plan:   Post-operative Plan:   Informed Consent: I have reviewed the patients History and Physical, chart, labs and discussed the procedure including the risks, benefits and alternatives for the proposed anesthesia with the patient or authorized representative who has indicated his/her understanding and acceptance.     Plan Discussed with: CRNA  Anesthesia Plan Comments:         Anesthesia Quick Evaluation

## 2015-08-23 NOTE — Brief Op Note (Addendum)
08/22/2015 - 08/23/2015  3:31 PM  PATIENT:  [name]  29 y.o. female  PRE-OPERATIVE DIAGNOSIS: Pregnancy at 37 0/7 weeks, Gestational HTN,  H/O Cesarean Section, unfavorable cervix, A2 Gestational Diabetes, Morbid obesity  POST-OPERATIVE DIAGNOSIS:  Seme  PROCEDURE:  Procedure(s): CESAREAN SECTION (N/A), Repeat LTCS, 2 layer closure Scar Revision  SURGEON:  Surgeon(s) and Role:        * Thurnell Lose, MD - Primary    * Waymon Amato, MD - Assisting       Venus Standard, CNM-Assisting  PHYSICIAN ASSISTANT:   ASSISTANTS: Technician   ANESTHESIA:   spinal  EBL:  Total I/O In: 2000 [I.V.:2000] Out: 1900 [Urine:1100; Blood:800]  BLOOD ADMINISTERED:none  DRAINS: Urinary Catheter (Foley)   LOCAL MEDICATIONS USED:  NONE  SPECIMEN:  Source of Specimen:  Placenta  DISPOSITION OF SPECIMEN:  PATHOLOGY  COUNTS:  YES  TOURNIQUET:  * No tourniquets in log *  DICTATION: .Other Dictation: Dictation Number [id number]  PLAN OF CARE: Transfer to mother-baby  PATIENT DISPOSITION:  PACU - hemodynamically stable.   Delay start of Pharmacological VTE agent (>24hrs) due to surgical blood loss or risk of bleeding: yes

## 2015-08-23 NOTE — Progress Notes (Signed)
Pt to OR via stretcher in stable condition.

## 2015-08-23 NOTE — Progress Notes (Signed)
Subjective: Reviewed  CNM note. Pt states she does not have visual changes or headaches.  Had contractions overnight while on monitor.  Somewhat painful but resolved after NST.  Good fetal movement. Pt states PCN allergy was somnolence, was "a long time ago."   Objective: Vital signs in last 24 hours: Temp:  [98.2 F (36.8 C)-99.6 F (37.6 C)] 99.4 F (37.4 C) (10/08 1214) Pulse Rate:  [89-113] 97 (10/08 1228) Resp:  [18-20] 20 (10/08 1214) BP: (109-138)/(58-99) 136/92 mmHg (10/08 1228) SpO2:  [96 %-100 %] 100 % (10/08 0652) Weight:  [150.821 kg (332 lb 8 oz)-151.32 kg (333 lb 9.6 oz)] 150.821 kg (332 lb 8 oz) (10/08 0526)  Physical Exam:  General: alert, cooperative and no distress Ext:  SCDs on.   Recent Labs  08/22/15 1150 08/23/15 1143  HGB 10.9* 11.0*  HCT 33.3* 34.0*   CBG 79, 79  Assessment/Plan: IUP at 37 0/7 weeks. Gestational HTN, BP mildly elevated. Gestational DM. Blood glucose low normal.  Check prior to surgery. Morbid obesity.  Will use PICO wound vac post op and pannus retractor during surgery.  Pt informed and agreeable. Proceed with repeat LTCS.  Opal Mckellips 08/23/2015, 1:01 PM

## 2015-08-23 NOTE — Anesthesia Procedure Notes (Signed)
Epidural Patient location during procedure: OR  Staffing Anesthesiologist: Caiya Bettes Performed by: anesthesiologist   Preanesthetic Checklist Completed: patient identified, site marked, surgical consent, pre-op evaluation, timeout performed, IV checked, risks and benefits discussed and monitors and equipment checked  Epidural Patient position: sitting Prep: ChloraPrep and site prepped and draped Patient monitoring: continuous pulse ox and blood pressure Approach: midline Location: L3-L4 Injection technique: LOR saline  Needle:  Needle type: Tuohy  Needle gauge: 17 G Needle length: 9 cm and 9 Needle insertion depth: 9 cm Catheter type: closed end flexible Catheter size: 19 Gauge Catheter at skin depth: 15 cm Test dose: negative  Assessment Events: blood not aspirated, injection not painful, no injection resistance, negative IV test and no paresthesia  Additional Notes Patient identified. Risks/Benefits/Options discussed with patient including but not limited to bleeding, infection, nerve damage, paralysis, failed block, incomplete pain control, headache, blood pressure changes, nausea, vomiting, reactions to medication both or allergic, itching and postpartum back pain. Confirmed with bedside nurse the patient's most recent platelet count. Confirmed with patient that they are not currently taking any anticoagulation, have any bleeding history or any family history of bleeding disorders. Patient expressed understanding and wished to proceed. All questions were answered. Sterile technique was used throughout the entire procedure. Please see nursing notes for vital signs. Test dose was given through epidural catheter and negative prior to continuing to dose epidural or start infusion.   This was a CSE. After LOR obtained, a 27ga sprotte spinal needle was passed easily through the tuohy and clear CSF return. Spinal dose given and needle withdrawn. Epidural cathether then easily passed  and tuohy withdrawn.

## 2015-08-24 ENCOUNTER — Encounter (HOSPITAL_COMMUNITY): Payer: Self-pay | Admitting: *Deleted

## 2015-08-24 LAB — CBC
HEMATOCRIT: 30.8 % — AB (ref 36.0–46.0)
HEMOGLOBIN: 9.8 g/dL — AB (ref 12.0–15.0)
MCH: 24.1 pg — AB (ref 26.0–34.0)
MCHC: 31.8 g/dL (ref 30.0–36.0)
MCV: 75.7 fL — ABNORMAL LOW (ref 78.0–100.0)
Platelets: 240 10*3/uL (ref 150–400)
RBC: 4.07 MIL/uL (ref 3.87–5.11)
RDW: 17.2 % — ABNORMAL HIGH (ref 11.5–15.5)
WBC: 7.8 10*3/uL (ref 4.0–10.5)

## 2015-08-24 LAB — GLUCOSE, CAPILLARY
GLUCOSE-CAPILLARY: 112 mg/dL — AB (ref 65–99)
Glucose-Capillary: 87 mg/dL (ref 65–99)

## 2015-08-24 LAB — RPR: RPR Ser Ql: NONREACTIVE

## 2015-08-24 MED ORDER — FERROUS SULFATE 325 (65 FE) MG PO TABS
325.0000 mg | ORAL_TABLET | Freq: Two times a day (BID) | ORAL | Status: DC
  Administered 2015-08-24 – 2015-08-26 (×4): 325 mg via ORAL
  Filled 2015-08-24 (×4): qty 1

## 2015-08-24 NOTE — Lactation Note (Addendum)
This note was copied from the chart of [name]. Lactation Consultation Note  Reviewed hand expression and mother was able to express a few drops. Latched baby in cradle hold.  Offered repositioning to football but mother states she prefers cradle. Reviewed depth and how to unlatch. Sucks and some swallows observed. Discussed cluster feeding and supply and demand. Encouraged mother to breastfeed often to establish her milk supply. Mother states she will pump in addition to breastfeeding and supplementing w/ formula. Mom encouraged to feed baby 8-12 times/24 hours and with feeding cues.  Mom made aware of O/P services, breastfeeding support groups, community resources, and our phone # for post-discharge questions.    Patient Name: Wendy Aarushi Hemric MIWOE'H Date: 08/24/2015 Reason for consult: Follow-up assessment   Maternal Data    Feeding Feeding Type: Breast Fed Length of feed: 20 min  LATCH Score/Interventions Latch: Grasps breast easily, tongue down, lips flanged, rhythmical sucking.  Audible Swallowing: A few with stimulation  Type of Nipple: Everted at rest and after stimulation  Comfort (Breast/Nipple): Soft / non-tender     Hold (Positioning): Assistance needed to correctly position infant at breast and maintain latch.  LATCH Score: 8  Lactation Tools Discussed/Used     Consult Status Consult Status: Follow-up Date: 08/25/15 Follow-up type: In-patient    Vivianne Master [facility] 08/24/2015, 2:00 PM

## 2015-08-24 NOTE — Op Note (Signed)
NAMEFANNIE, [name]            ACCOUNT NO.:  [id number]  MEDICAL RECORD NO.:  [id number]  LOCATION:  9127                          FACILITY:  [facility]  PHYSICIAN:  Jola Schmidt, MD   DATE OF BIRTH:  01/27/1986  DATE OF PROCEDURE:  08/23/2015 DATE OF DISCHARGE:                              OPERATIVE REPORT   PREOPERATIVE DIAGNOSES:  Intrauterine pregnancy at 59 and 0/7th weeks, gestational hypertension, history of cesarean section, unfavorable cervix, A2 gestational diabetes, and morbid obesity.  PROCEDURE:  Repeat low transverse cesarean section with 2-layer closure.  SURGEON:  Jola Schmidt, MD.  ASSISTANT:  Waymon Amato, MD, [facility] Standards Certified Nurse Midwife and technician.  ANESTHESIA:  Spinal.  ESTIMATED BLOOD LOSS:  1100.  BLOOD ADMINISTERED:  None.  DRAINS:  Foley.  SPECIMEN:  Placenta to Pathology.  PLAN OF CARE:  Transfer the patient to Mother Baby.  DISPOSITION:  PACU, hemodynamically stable.  COMPLICATIONS:  None.  FINDINGS:  Viable female infant in the vertex position with clear fluid. Apgars 8 and 8, weight was approximately 8 pounds 2 ounces.  Normal uterus, fallopian tubes, and ovaries bilaterally.  INDICATION FOR PROCEDURE:  [name] was undergoing antenatal testing for A2 gestational diabetes with MSM.  The day prior to delivery, the patient's blood pressure was in the 90s.  Serial observation showed 140 to 150s over high 90s.  Protein/creatinine ratio was 0.10.  She was given a diagnosis of gestational hypertension.  The patient was discussed with Maternal Fetal Medicine, recommended delivery at 37 weeks.  The patient was 36 and 0, she was observed overnight.  Fetal status was reassuring.  BPs were mildly elevated and the patient did not develop any symptoms of headache or symptoms of severe preeclampsia.  DESCRIPTION OF PROCEDURE:  The patient was taken to the operating room, where she underwent spinal anesthesia without complication.   She was then placed in the dorsal supine position.  Foley catheter was placed and a vaginal prep was performed.  The TRAXI retractor system was used to hold up her pannus with the extender.  The patient tolerated that well.  She was then prepped.  Time-out was taken and then the drape was placed.  After adequate anesthesia was confirmed, the Pfannenstiel skin incision was revised where we took out the old scar.  The incision was marked and a scalpel was used to outline the incision.  Allis clamps were used to grasp the skin and that was removed with the Bovie on cut. At certain points and coag, minimal bleeding noted.  The fascia was then opened with the Bovie down to the underlying layer of the fascia.  Hemostasis with a hemostat as needed.  The fascia was then identified, which was very deep.  The subcutaneous tissue was cleared off over top of the fascia.  The fascial incision was then extended laterally with the curved Mayo scissors.  There was some bleeding on the right side of the rectus muscle that was stopped with hemostat and Bovie.  The Kocher clamps were used to transect the fascia off the rectus muscles.  There were some dense adhesions.  We did that above and below, had to extended above once we  got into the peritoneum.  There was a dense scar tissue at the midline.  Opening was made with a hemostat and Kocher clamps were used to grasp.  I then used a scalpel to open up the midline scar tissue.  Once that was opened, we stretched the abdomen and broke up some of the scar tissue, but it was still adhered on the right-hand side.  At the top, there was some omentum there that was pretty thick.  So, I went over top of the thickened peritoneum up above under the fascia and that opened this up with a stretch one more time.  The Alexis retractor was then placed.  Bladder flap was developed with the Metzenbaum scissors.  The lower uterine segment was not excessively  thin.  Scalpel was used to incise the lower uterine segment and extended laterally, partially bimanually and then with the bandage scissors on the right-hand side.  The occiput was on the patient's left.  Vacuum was called to help rotate the baby's head.  Head was delivered through the hysterotomy incision easily.  Vacuum was released.  No nuchal cord noted.  Nose and mouth were suctioned.  Shoulders delivered easily. Nose and mouth were suctioned again.  Cord was clamped x2 and cut.  Baby handed off to the awaiting NICU team.  Cord blood was obtained.  The placenta was then removed manually.  The uterus was cleared of all clots and debris with 2 moist laparotomy sponges.  Hysterotomy incision was then reapproximated with 0 Vicryl in a continuous locked fashion.  A second layer of the same suture was used for imbrication.  The gutters were then cleared of all clots and debris with copious irrigation.  Of note, we did put a moistened laparotomy sponge on the left-hand side to hold back the bowel.  Bladder was not injured.  Urine was clear throughout the case.  The fascia was then reapproximated with 0 Vicryl with 2 separate sutures.  The subcutaneous space was then reapproximated with initially 2-0 plain gut, but 2 of those sutures broke, so we ended up completing the remaining of the incision with 0 Monocryl.  The skin was then reapproximated with 4-0 Monocryl and Steri-Strips and benzoin were applied at the incision and a PICO wound VAC was applied due to the increased pannus.  The patient had some oozing at the end of the procedure on the left-hand side.  We observed, it appeared to be coming from the fascia, the pressure was held.  Also, she did have some uterine atony.  Did not have excessive bleeding, but definitely had atony, so Pitocin 40 units in the bag and was open wider and that resolved it.  After the procedure finished, she still remained firm.  Fundal pressure did not  reveal an excessive amount of bleeding or large clots.  All instruments, sponge, and needle [name] were correct x3.  Ancef 3 g IV was administered prior to the procedure.  SCDs were on and operating at all times.  The patient tolerated the procedure well.  Baby initially had some grunting and went to the Nursery, but the baby was awaiting mom with father in the recovery room.  Upon her arrival was doing well.     Jola Schmidt, MD     EBV/MEDQ  D:  08/23/2015  T:  08/23/2015  Job:  [id number]

## 2015-08-24 NOTE — Progress Notes (Addendum)
Subjective: Postpartum Day 1: Cesarean Delivery due to gestational hypertension, history of cesarean section, unfavorable cervix, A2 gestational diabetes, and morbid obesity. Patient up ad lib, reports no syncope or dizziness. Feeding:  breast Contraceptive plan:  unsure  Objective: Vital signs in last 24 hours: Temp:  [97.7 F (36.5 C)-99.4 F (37.4 C)] 98.6 F (37 C) (10/09 0536) Pulse Rate:  [56-99] 82 (10/09 0536) Resp:  [18-73] 18 (10/09 0536) BP: (107-145)/(53-96) 113/53 mmHg (10/09 0536) SpO2:  [87 %-100 %] 98 % (10/09 0536)  CBG 112 pt ate prior to test  Physical Exam:  General: alert, cooperative and morbidly obese Lochia: appropriate Uterine Fundus: unable to assess d/t habitus Abdomen:  + bowel sounds, non distended Incision: no significant drainage  pressuredressing CDI DVT Evaluation: No evidence of DVT seen on physical exam. Homan's sign: Negative   Recent Labs  08/22/15 1150 08/23/15 1143 08/24/15 0700  HGB 10.9* 11.0* 9.8*  HCT 33.3* 34.0* 30.8*  WBC 6.8 5.8 7.8   Orthostatics stable.  Assessment: Status post Cesarean section day 1. Doing well postoperatively.  pressuredressing in place, no significant drainage PICO wound vac in place, no significant drainage reported Anemia - hemodynamicly stable.   Circumcision: out patient CBG 2hr PP   Plan: Continue current care. Breastfeeding and Lactation consult RPR labs Iron supplements  Kenslei Hearty, CNM, MSN 08/24/2015. 8:19 AM

## 2015-08-25 ENCOUNTER — Encounter (HOSPITAL_COMMUNITY): Payer: Self-pay | Admitting: Obstetrics and Gynecology

## 2015-08-25 LAB — GLUCOSE, CAPILLARY: GLUCOSE-CAPILLARY: 111 mg/dL — AB (ref 65–99)

## 2015-08-25 MED ORDER — INFLUENZA VAC SPLIT QUAD 0.5 ML IM SUSY
0.5000 mL | PREFILLED_SYRINGE | INTRAMUSCULAR | Status: AC
Start: 2015-08-26 — End: 2015-08-25
  Administered 2015-08-25: 0.5 mL via INTRAMUSCULAR

## 2015-08-25 NOTE — Lactation Note (Signed)
This note was copied from the chart of [name]. Lactation Consultation Note  Patient Name: Wendy Daesia Zylka [id number] Date: 08/25/2015 Reason for consult: Breast/nipple pain;Difficult latch;Follow-up assessment Partially bf mom that is having nipple pain and trouble latching baby. FOB reports that baby gets so worked up mom can not get him latched so they give him bottle of formula. Baby was laying in basinet with a pacifier. Went over artifical nipples, how supplementing can impact mom's supply, baby behavior, soothing, and feeding cues. Mom does have a DEBP but only uses it when baby gets formula. She reports that she is not able to pump enough to feed back. Offered latch help, they were eager to learn how to get baby onto the R breast, they said he goes to L easily. Assisted mom with cross cradle, when baby came off, FOB was able to assist mom with cross cradle. They are both aware of O/P lactation and support group. They will page as needed for bf help.   Maternal Data    Feeding Feeding Type: Breast Fed Length of feed: 10 min (still going )  LATCH Score/Interventions Latch: Grasps breast easily, tongue down, lips flanged, rhythmical sucking.  Audible Swallowing: A few with stimulation Intervention(s): Skin to skin  Type of Nipple: Everted at rest and after stimulation  Comfort (Breast/Nipple): Soft / non-tender     Hold (Positioning): Assistance needed to correctly position infant at breast and maintain latch. Intervention(s): Support Pillows;Position options  LATCH Score: 8  Lactation Tools Discussed/Used     Consult Status Consult Status: Follow-up Date: 08/26/15 Follow-up type: In-patient    Denzil Hughes 08/25/2015, 10:32 PM

## 2015-08-25 NOTE — Progress Notes (Signed)
UR chart review completed.  

## 2015-08-25 NOTE — Progress Notes (Signed)
Subjective: Postop Day 2: Cesarean Delivery No complaints.  Pain controlled.  Lochia normal.  Breast feeding yes.  Pt denies headaches or visual changes.  Pt has not ambulated in halls.   Objective: Temp:  [98.2 F (36.8 C)-98.7 F (37.1 C)] 98.7 F (37.1 C) (10/10 0800) Pulse Rate:  [85-100] 85 (10/10 0800) Resp:  [18-20] 20 (10/10 0800) BP: (110-142)/(68-89) 135/89 mmHg (10/10 0800) SpO2:  [99 %] 99 % (10/09 0930)  Physical Exam: Gen: NAD Lochia: Not visualized Uterine Fundus: firm, appropriately tender Incision: clean, dry and intact, healing well.  Wound vac with good seal, pump is operable. DVT Evaluation:  Edema present, no calf tenderness bilaterally, SCDs off.    Recent Labs  08/23/15 1143 08/24/15 0700  HGB 11.0* 9.8*  HCT 34.0* 30.8*   CBGS reviewed, normal.  Assessment/Plan: Status post C-section-doing well postoperatively. Gestational HTN Blood pressure is normal pt is asymptomatic. Gestational Diabetes off Glyburide.  Blood glucose normal. Morbid obesity.  PICO wound vac in place and operating.  Recommend pt ambulate in halls TID and wear SCDs when not ambulating.  Lactation support. Outpatient circumcision. Anticipate discharge in am.      Sylvain Hasten 08/25/2015, 8:35 AM

## 2015-08-26 ENCOUNTER — Other Ambulatory Visit (HOSPITAL_COMMUNITY): Payer: 59

## 2015-08-26 LAB — GLUCOSE, CAPILLARY: GLUCOSE-CAPILLARY: 81 mg/dL (ref 65–99)

## 2015-08-26 MED ORDER — OXYCODONE-ACETAMINOPHEN 5-325 MG PO TABS: 1.0000 | ORAL_TABLET | ORAL | Status: DC | PRN

## 2015-08-26 MED ORDER — IBUPROFEN 600 MG PO TABS: 600.0000 mg | ORAL_TABLET | Freq: Four times a day (QID) | ORAL | Status: DC

## 2015-08-26 NOTE — Progress Notes (Signed)
Subjective: Postop Day 3: Cesarean Delivery No complaints.  Pain controlled.  Lochia minimal.  Breast feeding yes, lactation consult overnight was very helpful.  Pt denies headaches or visual changes.  Pt has ambulated in halls.   Objective: Temp:  [97.7 F (36.5 C)-98.7 F (37.1 C)] 98.2 F (36.8 C) (10/11 0620) Pulse Rate:  [89-104] 95 (10/11 0620) Resp:  [18-20] 18 (10/11 0620) BP: (121-132)/(74-92) 132/92 mmHg (10/11 2409)  Physical Exam: Gen: NAD Lochia: Not visualized Uterine Fundus: firm, appropriately tender Incision: clean, dry and intact, healing well.  Wound vac with good seal, pump is operable. DVT Evaluation:  Edema present, no calf tenderness bilaterally, SCDs off.    Recent Labs  08/23/15 1143 08/24/15 0700  HGB 11.0* 9.8*  HCT 34.0* 30.8*   CBGS reviewed, normal.  Assessment/Plan: Status post C-section-doing well postoperatively. Gestational HTN Blood pressure is normal pt is asymptomatic.  BP this am was slightly elevated but pt states she was having breast tenderness.  Repeat BP prior to discharge.  BP check in 1 week at office. Gestational Diabetes off Glyburide.  Blood glucose normal. Morbid obesity.  PICO wound vac in place and operating.   Lactation support. Outpatient circumcision. Discharge today.      [name] 08/26/2015, 9:06 AM

## 2015-08-26 NOTE — Discharge Instructions (Signed)
Cesarean Delivery, Care After  Refer to this sheet in the next few weeks. These instructions provide you with information on caring for yourself after your procedure. Your health care provider may also give you specific instructions. Your treatment has been planned according to current medical practices, but problems sometimes occur. Call your health care provider if you have any problems or questions after you go home.  HOME CARE INSTRUCTIONS   Only take over-the-counter or prescription medications as directed by your health care provider.   Do not drink alcohol, especially if you are breastfeeding or taking medication to relieve pain.   Do not chew or smoke tobacco.   Continue to use good perineal care. Good perineal care includes:    Wiping your perineum from front to back.    Keeping your perineum clean.   Check your surgical cut (incision) daily for increased redness, drainage, swelling, or separation of skin.   Clean your incision gently with soap and water every day, and then pat it dry. If your health care provider says it is okay, leave the incision uncovered. Use a bandage (dressing) if the incision is draining fluid or appears irritated. If the adhesive strips across the incision do not fall off within 7 days, carefully peel them off.   Hug a pillow when coughing or sneezing until your incision is healed. This helps to relieve pain.   Do not use tampons or douche until your health care provider says it is okay.   Shower, wash your hair, and take tub baths as directed by your health care provider.   Wear a well-fitting bra that provides breast support.   Limit wearing support panties or control-top hose.   Drink enough fluids to keep your urine clear or pale yellow.   Eat high-fiber foods such as whole grain cereals and breads, brown rice, beans, and fresh fruits and vegetables every day. These foods may help prevent or relieve constipation.   Resume activities such as climbing stairs,  driving, lifting, exercising, or traveling as directed by your health care provider.   Talk to your health care provider about resuming sexual activities. This is dependent upon your risk of infection, your rate of healing, and your comfort and desire to resume sexual activity.   Try to have someone help you with your household activities and your newborn for at least a few days after you leave the hospital.   Rest as much as possible. Try to rest or take a nap when your newborn is sleeping.   Increase your activities gradually.   Keep all of your scheduled postpartum appointments. It is very important to keep your scheduled follow-up appointments. At these appointments, your health care provider will be checking to make sure that you are healing physically and emotionally.  SEEK MEDICAL CARE IF:    You are passing large clots from your vagina. Save any clots to show your health care provider.   You have a foul smelling discharge from your vagina.   You have trouble urinating.   You are urinating frequently.   You have pain when you urinate.   You have a change in your bowel movements.   You have increasing redness, pain, or swelling near your incision.   You have pus draining from your incision.   Your incision is separating.   You have painful, hard, or reddened breasts.   You have a severe headache.   You have blurred vision or see spots.   You feel sad   or depressed.   You have thoughts of hurting yourself or your newborn.   You have questions about your care, the care of your newborn, or medications.   You are dizzy or light-headed.   You have a rash.   You have pain, redness, or swelling at the site of the removed intravenous access (IV) tube.   You have nausea or vomiting.   You stopped breastfeeding and have not had a menstrual period within 12 weeks of stopping.   You are not breastfeeding and have not had a menstrual period within 12 weeks of delivery.   You have a fever.  SEEK  IMMEDIATE MEDICAL CARE IF:   You have persistent pain.   You have chest pain.   You have shortness of breath.   You faint.   You have leg pain.   You have stomach pain.   Your vaginal bleeding saturates 2 or more sanitary pads in 1 hour.  MAKE SURE YOU:    Understand these instructions.   Will watch your condition.   Will get help right away if you are not doing well or get worse.     This information is not intended to replace advice given to you by your health care provider. Make sure you discuss any questions you have with your health care provider.     Document Released: 07/24/2002 Document Revised: 11/22/2014 Document Reviewed: 06/28/2012  Elsevier Interactive Patient Education 2016 [employer]

## 2015-08-26 NOTE — Lactation Note (Signed)
This note was copied from the chart of [name]. Lactation Consultation Note  Patient Name: Wendy Taylormarie Register [id number] Date: 08/26/2015 Reason for consult: Follow-up assessment Mom reports baby is cluster feeding but she feels the latch is improving. She denies nipple trauma, some mild tenderness. Declined LC to help with latch this am, ready to go home. Care for sore nipples reviewed, comfort gels given with instructions. Engorgement care reviewed if needed. Advised of OP services and support group.   Maternal Data    Feeding Feeding Type: Breast Fed Length of feed: 40 min  LATCH Score/Interventions                      Lactation Tools Discussed/Used Tools: Comfort gels   Consult Status Consult Status: Complete Date: 08/26/15 Follow-up type: In-patient    Katrine Coho 08/26/2015, 10:40 AM

## 2015-08-26 NOTE — Discharge Summary (Signed)
OB Discharge Summary     Patient Name: [name] DOB: [birth year] MRN: [id number]  Date of admission: 08/22/2015 Delivering MD: Thurnell Lose   Date of discharge: 08/26/2015  Admitting diagnosis: 91WKS, ELEVATED BP Z98.89  H/O Cesarean Section Intrauterine pregnancy: [redacted]w[redacted]d     Secondary diagnosis: Gestational Diabetes medication controlled (A2) and Macrosomic infant, H/o previous cesarean section, Morbid Obesity     Discharge diagnosis: Gestational Hypertension and GDM A2                                                                                                Post partum procedures:None, pt did not receive Magnesium sulfate  Augmentation: n/a  Complications: None  Hospital course:  Sceduled C/S   29 y.o. yo G2P1101 at [redacted]w[redacted]d was admitted to the hospital 08/22/2015 for scheduled cesarean section with the following indication:Gestational HTN, Unfavorable cervix, h/o previous cesarean section..  Membrane Rupture Time/Date: 2:10 PM ,08/23/2015   Patient delivered a Viable infant.08/23/2015  Details of operation can be found in separate operative note.  Pateint had an uncomplicated postpartum course.  She is ambulating, tolerating a regular diet, passing flatus, and urinating well. Patient is discharged home in stable condition on 08/26/2015 12:25 PM          Physical exam  Filed Vitals:   08/25/15 0800 08/25/15 1230 08/25/15 1848 08/26/15 0620  BP: 135/89 124/74 121/78 132/92  Pulse: 85 89 104 95  Temp: 98.7 F (37.1 C) 98.7 F (37.1 C) 97.7 F (36.5 C) 98.2 F (36.8 C)  TempSrc: Oral Oral Oral Oral  Resp: 20 20 20 18   Height:      Weight:      SpO2:       General: alert, cooperative and no distress Lochia: appropriate Uterine Fundus: firm Incision: Dressing is clean, dry, and intact, large pannus.  PICO vacuum operable. DVT Evaluation: No evidence of DVT seen on physical exam. Calf/Ankle edema is present Labs: Lab Results  Component Value Date   WBC  7.8 08/24/2015   HGB 9.8* 08/24/2015   HCT 30.8* 08/24/2015   MCV 75.7* 08/24/2015   PLT 240 08/24/2015   CMP Latest Ref Rng 08/22/2015  Glucose 65 - 99 mg/dL 145(H)  BUN 6 - 20 mg/dL 9  Creatinine 0.44 - 1.00 mg/dL 0.49  Sodium 135 - 145 mmol/L 133(L)  Potassium 3.5 - 5.1 mmol/L 3.8  Chloride 101 - 111 mmol/L 105  CO2 22 - 32 mmol/L 22  Calcium 8.9 - 10.3 mg/dL 8.8(L)  Total Protein 6.5 - 8.1 g/dL 6.4(L)  Total Bilirubin 0.3 - 1.2 mg/dL 0.3  Alkaline Phos 38 - 126 U/L 129(H)  AST 15 - 41 U/L 21  ALT 14 - 54 U/L 24    Discharge instruction: per After Visit Summary and "Baby and Me Booklet".  Medications:  Current facility-administered medications:  .  acetaminophen (TYLENOL) tablet 650 mg, 650 mg, Oral, Q4H PRN, Thurnell Lose, MD .  witch hazel-glycerin (TUCKS) pad 1 application, 1 application, Topical, PRN **AND** dibucaine (NUPERCAINAL) 1 % rectal ointment 1 application, 1 application, Rectal, PRN, Thurnell Lose, MD .  diphenhydrAMINE (BENADRYL) injection 12.5 mg, 12.5 mg, Intravenous, Q4H PRN **OR** diphenhydrAMINE (BENADRYL) capsule 25 mg, 25 mg, Oral, Q4H PRN, Lauretta Grill, MD .  diphenhydrAMINE (BENADRYL) capsule 25 mg, 25 mg, Oral, Q6H PRN, Thurnell Lose, MD .  fentaNYL (SUBLIMAZE) injection 25-50 mcg, 25-50 mcg, Intravenous, Q5 min PRN, Lauretta Grill, MD .  ferrous sulfate tablet 325 mg, 325 mg, Oral, BID WC, Venus Standard, CNM, 325 mg at 08/26/15 0852 .  ibuprofen (ADVIL,MOTRIN) tablet 600 mg, 600 mg, Oral, 4 times per day, Thurnell Lose, MD, 600 mg at 08/26/15 0618 .  lactated ringers infusion, , Intravenous, Continuous, Thurnell Lose, MD, Stopped at 08/24/15 0545 .  lanolin ointment 1 application, 1 application, Topical, PRN, Thurnell Lose, MD .  menthol-cetylpyridinium (CEPACOL) lozenge 3 mg, 1 lozenge, Oral, Q2H PRN, Thurnell Lose, MD .  meperidine (DEMEROL) injection 6.25 mg, 6.25 mg, Intravenous, Q5 min PRN, Lauretta Grill, MD .  misoprostol (CYTOTEC) tablet 800 mcg,  800 mcg, Rectal, Once PRN, Thurnell Lose, MD .  nalbuphine (NUBAIN) injection 5 mg, 5 mg, Intravenous, Q4H PRN **OR** nalbuphine (NUBAIN) injection 5 mg, 5 mg, Subcutaneous, Q4H PRN, Lauretta Grill, MD .  nalbuphine (NUBAIN) injection 5 mg, 5 mg, Intravenous, Once PRN **OR** nalbuphine (NUBAIN) injection 5 mg, 5 mg, Subcutaneous, Once PRN, Lauretta Grill, MD .  naloxone [facility]) 2 mg in dextrose 5 % 250 mL infusion, 1-4 mcg/kg/hr, Intravenous, Continuous PRN, Lauretta Grill, MD .  naloxone [facility]) injection 0.4 mg, 0.4 mg, Intravenous, PRN **AND** sodium chloride 0.9 % injection 3 mL, 3 mL, Intravenous, PRN, Lauretta Grill, MD .  ondansetron [facility]) injection 4 mg, 4 mg, Intravenous, Q8H PRN, Lauretta Grill, MD .  ondansetron [facility]) injection 4 mg, 4 mg, Intravenous, Once PRN, Lauretta Grill, MD .  oxyCODONE-acetaminophen (PERCOCET/ROXICET) 5-325 MG per tablet 1 tablet, 1 tablet, Oral, Q4H PRN, Thurnell Lose, MD, 1 tablet at 08/26/15 0618 .  oxyCODONE-acetaminophen (PERCOCET/ROXICET) 5-325 MG per tablet 2 tablet, 2 tablet, Oral, Q4H PRN, Thurnell Lose, MD, 2 tablet at 08/25/15 1536 .  prenatal multivitamin tablet 1 tablet, 1 tablet, Oral, Q1200, Thurnell Lose, MD, 1 tablet at 08/25/15 1120 .  scopolamine (TRANSDERM-SCOP) 1 MG/3DAYS 1.5 mg, 1 patch, Transdermal, Once, Lauretta Grill, MD .  senna-docusate (Senokot-S) tablet 2 tablet, 2 tablet, Oral, Q24H, Thurnell Lose, MD, 2 tablet at 08/25/15 2325 .  simethicone (MYLICON) chewable tablet 80 mg, 80 mg, Oral, TID PC, Thurnell Lose, MD, 80 mg at 08/26/15 0853 .  simethicone (MYLICON) chewable tablet 80 mg, 80 mg, Oral, Q24H, Thurnell Lose, MD, 80 mg at 08/25/15 2325 .  simethicone (MYLICON) chewable tablet 80 mg, 80 mg, Oral, PRN, Thurnell Lose, MD, 80 mg at 08/25/15 1919 .  Tdap (BOOSTRIX) injection 0.5 mL, 0.5 mL, Intramuscular, Once, Thurnell Lose, MD, 0.5 mL at 08/24/15 0948 .  zolpidem (AMBIEN) tablet 5 mg, 5 mg, Oral, QHS PRN, Thurnell Lose, MD  Diet: routine  diet  Activity: Advance as tolerated. Pelvic rest for 6 weeks.   Outpatient follow up:2 weeks  Postpartum contraception: Not Discussed  Newborn Data: Live born female  Birth Weight: 8 lb 2.9 oz (3710 g) APGAR: 8, 8  Baby Feeding: Breast Disposition:home with mother   08/26/2015 Thurnell Lose, MD

## 2015-08-29 ENCOUNTER — Ambulatory Visit (HOSPITAL_COMMUNITY): Payer: 59

## 2015-08-29 ENCOUNTER — Other Ambulatory Visit (HOSPITAL_COMMUNITY): Payer: 59

## 2015-09-02 ENCOUNTER — Other Ambulatory Visit (HOSPITAL_COMMUNITY): Payer: 59

## 2015-09-09 ENCOUNTER — Inpatient Hospital Stay (HOSPITAL_COMMUNITY)
Admission: RE | Admit: 2015-09-09 | Discharge: 2015-09-09 | Disposition: A | Discharge status: Home / Self Care | Payer: Medicaid Other | Source: Ambulatory Visit

## 2015-09-10 ENCOUNTER — Inpatient Hospital Stay (HOSPITAL_COMMUNITY): Admission: AD | Admit: 2015-09-10 | Payer: 59 | Source: Ambulatory Visit | Admitting: Obstetrics and Gynecology

## 2015-10-03 ENCOUNTER — Other Ambulatory Visit (HOSPITAL_COMMUNITY)
Admission: RE | Admit: 2015-10-03 | Discharge: 2015-10-03 | Disposition: A | Discharge status: Home / Self Care | Payer: Medicaid Other | Source: Ambulatory Visit | Attending: Obstetrics and Gynecology | Admitting: Obstetrics and Gynecology

## 2015-10-03 ENCOUNTER — Other Ambulatory Visit: Payer: Self-pay | Admitting: Obstetrics and Gynecology

## 2015-10-03 DIAGNOSIS — Z01419 Encounter for gynecological examination (general) (routine) without abnormal findings: Secondary | ICD-10-CM | POA: Insufficient documentation

## 2015-10-03 DIAGNOSIS — Z1151 Encounter for screening for human papillomavirus (HPV): Secondary | ICD-10-CM | POA: Insufficient documentation

## 2015-10-07 LAB — CYTOLOGY - PAP

## 2016-02-10 ENCOUNTER — Encounter: Payer: Self-pay | Admitting: Obstetrics and Gynecology

## 2019-06-12 ENCOUNTER — Other Ambulatory Visit: Payer: Self-pay | Admitting: Obstetrics and Gynecology

## 2019-06-12 ENCOUNTER — Other Ambulatory Visit (HOSPITAL_COMMUNITY)
Admission: RE | Admit: 2019-06-12 | Discharge: 2019-06-12 | Disposition: A | Discharge status: Home / Self Care | Payer: BC Managed Care – PPO | Source: Ambulatory Visit | Attending: Obstetrics and Gynecology | Admitting: Obstetrics and Gynecology

## 2019-06-12 DIAGNOSIS — Z124 Encounter for screening for malignant neoplasm of cervix: Secondary | ICD-10-CM | POA: Insufficient documentation

## 2019-06-15 LAB — CYTOLOGY - PAP
Diagnosis: NEGATIVE
HPV: NOT DETECTED

## 2020-12-23 LAB — OB RESULTS CONSOLE ABO/RH: RH Type: POSITIVE

## 2020-12-23 LAB — OB RESULTS CONSOLE GC/CHLAMYDIA
Chlamydia: NEGATIVE
Gonorrhea: NEGATIVE

## 2020-12-23 LAB — OB RESULTS CONSOLE RUBELLA ANTIBODY, IGM: Rubella: IMMUNE

## 2020-12-23 LAB — OB RESULTS CONSOLE ANTIBODY SCREEN: Antibody Screen: NEGATIVE

## 2020-12-23 LAB — OB RESULTS CONSOLE HIV ANTIBODY (ROUTINE TESTING): HIV: NONREACTIVE

## 2020-12-23 LAB — OB RESULTS CONSOLE HEPATITIS B SURFACE ANTIGEN: Hepatitis B Surface Ag: NEGATIVE

## 2020-12-23 LAB — OB RESULTS CONSOLE RPR: RPR: NONREACTIVE

## 2021-01-05 ENCOUNTER — Other Ambulatory Visit: Payer: Self-pay | Admitting: Obstetrics and Gynecology

## 2021-01-05 DIAGNOSIS — Z363 Encounter for antenatal screening for malformations: Secondary | ICD-10-CM

## 2021-02-05 ENCOUNTER — Telehealth: Payer: Self-pay

## 2021-02-05 ENCOUNTER — Encounter: Payer: Self-pay | Admitting: *Deleted

## 2021-02-05 NOTE — Telephone Encounter (Signed)
Called [facility] regarding Genetic Screening being drawn in their office.  Spoke with Claiborne Billings who said that she has not had any Genetic Screening done in their office

## 2021-02-09 ENCOUNTER — Ambulatory Visit: Payer: BC Managed Care – PPO | Admitting: *Deleted

## 2021-02-09 ENCOUNTER — Ambulatory Visit: Payer: Self-pay | Admitting: Genetic Counselor

## 2021-02-09 ENCOUNTER — Other Ambulatory Visit: Payer: Self-pay | Admitting: *Deleted

## 2021-02-09 ENCOUNTER — Other Ambulatory Visit: Payer: Self-pay

## 2021-02-09 ENCOUNTER — Ambulatory Visit: Payer: BC Managed Care – PPO | Attending: Obstetrics and Gynecology

## 2021-02-09 ENCOUNTER — Ambulatory Visit (HOSPITAL_BASED_OUTPATIENT_CLINIC_OR_DEPARTMENT_OTHER): Payer: BC Managed Care – PPO | Admitting: Genetic Counselor

## 2021-02-09 VITALS — BP 125/77 | HR 109

## 2021-02-09 DIAGNOSIS — Z6841 Body Mass Index (BMI) 40.0 and over, adult: Secondary | ICD-10-CM

## 2021-02-09 DIAGNOSIS — Z363 Encounter for antenatal screening for malformations: Secondary | ICD-10-CM | POA: Diagnosis not present

## 2021-02-09 DIAGNOSIS — Z3A20 20 weeks gestation of pregnancy: Secondary | ICD-10-CM

## 2021-02-09 DIAGNOSIS — O99212 Obesity complicating pregnancy, second trimester: Secondary | ICD-10-CM

## 2021-02-09 DIAGNOSIS — O24419 Gestational diabetes mellitus in pregnancy, unspecified control: Secondary | ICD-10-CM

## 2021-02-09 DIAGNOSIS — Z8632 Personal history of gestational diabetes: Secondary | ICD-10-CM

## 2021-02-09 DIAGNOSIS — O09522 Supervision of elderly multigravida, second trimester: Secondary | ICD-10-CM

## 2021-02-09 DIAGNOSIS — O34219 Maternal care for unspecified type scar from previous cesarean delivery: Secondary | ICD-10-CM

## 2021-02-09 DIAGNOSIS — Z315 Encounter for genetic counseling: Secondary | ICD-10-CM

## 2021-02-09 DIAGNOSIS — Z8759 Personal history of other complications of pregnancy, childbirth and the puerperium: Secondary | ICD-10-CM

## 2021-02-09 DIAGNOSIS — O09292 Supervision of pregnancy with other poor reproductive or obstetric history, second trimester: Secondary | ICD-10-CM | POA: Insufficient documentation

## 2021-02-09 NOTE — Progress Notes (Signed)
02/09/2021  [name] 10/25/1986 MRN: [id number] DOV: 02/09/2021  [name] presented to the [facility] for a genetics consultation regarding her history of a previous child with Down syndrome. [name] was accompanied to her appointment by her husband, [name].   Indication for genetic counseling - Previous child with Down syndrome  Prenatal history  [name] is a [id number], 35 y.o. female. Her current pregnancy has completed [redacted]w[redacted]d (Estimated Date of Delivery: 06/28/21). [name] and her partner had a daughter who died at age 66 months. They also have a 9 year old son. [name] has also had a miscarriage at 31 weeks.  [name] denied exposure to environmental toxins or chemical agents. She denied the use of alcohol, tobacco or street drugs. She reported taking prenatal vitamins, iron, Metformin, and aspirin. She denied significant bleeding during the course of her pregnancy. She had COVID around 13-14 weeks of pregnancy. She has had two prior Cesarean sections. Her medical and surgical histories were otherwise noncontributory.  Family History  A three generation pedigree was drafted and reviewed. The family history is remarkable for the following:  - [name] and her husband had a daughter, [name], with Down syndrome. Unfortunately, she died at 45 months of surgical complications related to her AV canal defect. See Discussion section for more details.   The remaining family histories were reviewed and found to be noncontributory for birth defects, intellectual disability, recurrent pregnancy loss, and known genetic conditions.    The patient's ancestry is African American. The father of the pregnancy's ancestry is African American. Ashkenazi Jewish ancestry and consanguinity were denied. Pedigree will be scanned under Media.  Discussion  Previous child with trisomy 63:  [name] was referred for genetic counseling  due to her history of having a previous child with trisomy 58 AKA Down syndrome. 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 Noninvasive prenatal screening (NIPS) results during this pregnancy were high-risk for Down syndrome. [name] was born prematurely at 32 weeks. 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 She lived for 11 months, ultimately passing away from surgical complications at age 76 months.  Down syndrome is one of the most common extra chromosome conditions, as ~1 in 800 babies are born with this condition. There are different types of Down syndrome, with each type determined by the arrangement of the chromosome 21 pair. Approximately 95% of cases are caused by an entire extra copy of chromosome 21 (trisomy 21). This was the case for [name]. Down syndrome most commonly occurs by chance due to an error in chromosomal division during the formation of egg and sperm cells in a process called nondisjunction.    Down syndrome is characterized by a distinctive facial appearance, mild to moderate intellectual disability, and an increased chance for a heart defect. Approximately half of babies with Down syndrome are born with a heart defect that may require surgery after birth. While many children with Down syndrome look similar to each other, each child with Down syndrome is unique and will have many more features in common with his or her own family members. Children with Down syndrome also have an increased chance for thyroid problems, which can range from an underactive to an overactive thyroid. Additionally, low muscle tone, vision problems, and respiratory and ear infections are more common among babies with Down syndrome. There are many more features  that can be associated with Down syndrome; however, it is not possible to accurately predict all features  that would be present in an individual with Down syndrome prenatally. Additionally, there is a high degree of variability seen among children who have this condition, meaning that every child with Down syndrome will not be affected in exactly the same way, and some children will have more or less features than others.   We reviewed that the risk of having a child with a chromosome condition increases after having a previous pregnancy affected by a chromosomal aneuploidy such as Down syndrome. Since [name] was under the age of 80 during her pregnancy with [name], and since she is over the age of 57 during the current pregnancy, her risk of having another pregnancy affected by Down syndrome increases 2.2-fold [name] et al., 2004). Based on her age and being in the second trimester, there is a 1 in 41 (0.3%) chance of the current fetus having Down syndrome. The adjusted risk for the current fetus to have Down syndrome thus increases to ~0.7%. [name] also has a 2.4-fold increased risk to have a pregnancy affected by a chromosomal aneuploidy other than Down syndrome. Thus, [name] has a ~1.7% chance of having a liveborn child with a different chromosomal aneuploidy, such as trisomy 19 or trisomy 78.   Ultrasound:  A complete ultrasound was performed today prior to our visit. The ultrasound report will be sent under separate cover. Views of fetal anatomy were limited. However, the four-chamber view of the heart appeared normal.   [name] was counseled that ~50% of fetuses with Down syndrome and 90-95% of fetuses with trisomy 74, trisomy 5, or triploidy demonstrate a sign of the respective conditions on prenatal ultrasound. We discussed that given her history of a child with a congenital heart defect, a fetal echocardiogram is recommended.  Screening/testing options:  Given that [name] had not yet had aneuploidy screening during the current pregnancy, we reviewed noninvasive  prenatal screening (NIPS) as an available screening option. Specifically, we discussed that NIPS analyzes cell free DNA originating from the placenta that is found in the maternal blood circulation during pregnancy. This test is not diagnostic for chromosome conditions, but can provide information regarding the presence or absence of extra fetal DNA for chromosomes 13, 18, 21, and the sex chromosomes. Thus, it would not identify or rule out all fetal aneuploidy. The reported detection rate is 91-99% for trisomies 21, 18, 13, and sex chromosome aneuploidies. The false positive rate is reported to be less than 0.1% for any of these conditions. Ms. Quinones indicated that she is not interested in undergoing NIPS. She had NIPS that failed several times due to low fetal fraction in her previous pregnancy and was reluctant to pursue screening again for this reason.  Ms. Mullane was also counseled regarding diagnostic testing via amniocentesis. We discussed the technical aspects of the procedure and quoted up to a 1 in 500 (0.2%) risk for spontaneous pregnancy loss or other adverse pregnancy outcomes as a result of amniocentesis. Cultured cells from an amniocentesis sample allow for the visualization of a fetal karyotype, which can detect >99% of large chromosomal aberrations, including chromosomal aneuploidies such as trisomy 21. Chromosomal microarray can also be performed to identify smaller deletions or duplications of fetal chromosomal material. After careful consideration, Ms. Deroo declined amniocentesis at this time. She understands that amniocentesis is available at any point after 16 weeks of pregnancy and that she may opt to  undergo the procedure at a later date should she change her mind.  Plan:  Additional screening and diagnostic testing were declined today. Ms. Sytsma understands that screening tests, including ultrasound, cannot rule out all birth defects or genetic syndromes. We reviewed that  it is recommended that Ms. Axford has a fetal echocardiogram due to her history of a child with a congenital heart defect. We will place this referral.   I counseled Ms. Claycomb regarding the above risks and available options. Second year [employer] genetic counseling student Thayer Headings [place] participated in portions of today's session under my supervision. The approximate face-to-face time with the genetic counselor was 25 minutes.  In summary:  Discussed history of previous child with trisomy 73  Adjusted risk for current fetus to be affected by trisomy 21: ~0.7%  Adjusted risk for current fetus to be affected by a different chromosomal aneuploidy: ~1.7%  Reviewed results of ultrasound  Limited views of fetal anatomy, but normal-appearing four chamber view  Offered additional testing and screening  Declined NIPS  Declined amniocentesis  Recommend fetal echocardiogram. We will place referral  Reviewed family history concerns   Buelah Manis, MS, Counselling psychologist

## 2021-03-06 ENCOUNTER — Telehealth: Payer: Self-pay

## 2021-03-06 NOTE — Telephone Encounter (Signed)
mar/ fetal echo scheduled for: 04/01/2021@830a .

## 2021-03-11 ENCOUNTER — Other Ambulatory Visit: Payer: Self-pay

## 2021-03-11 ENCOUNTER — Encounter: Payer: Self-pay | Admitting: *Deleted

## 2021-03-11 ENCOUNTER — Ambulatory Visit: Payer: BC Managed Care – PPO | Admitting: *Deleted

## 2021-03-11 ENCOUNTER — Ambulatory Visit: Payer: BC Managed Care – PPO | Attending: Obstetrics

## 2021-03-11 VITALS — BP 127/80 | HR 72

## 2021-03-11 DIAGNOSIS — O34219 Maternal care for unspecified type scar from previous cesarean delivery: Secondary | ICD-10-CM

## 2021-03-11 DIAGNOSIS — O09292 Supervision of pregnancy with other poor reproductive or obstetric history, second trimester: Secondary | ICD-10-CM

## 2021-03-11 DIAGNOSIS — Z3A24 24 weeks gestation of pregnancy: Secondary | ICD-10-CM

## 2021-03-11 DIAGNOSIS — O09522 Supervision of elderly multigravida, second trimester: Secondary | ICD-10-CM

## 2021-03-11 DIAGNOSIS — Z8759 Personal history of other complications of pregnancy, childbirth and the puerperium: Secondary | ICD-10-CM

## 2021-03-11 DIAGNOSIS — Z363 Encounter for antenatal screening for malformations: Secondary | ICD-10-CM

## 2021-03-11 DIAGNOSIS — O99212 Obesity complicating pregnancy, second trimester: Secondary | ICD-10-CM

## 2021-03-11 DIAGNOSIS — O24419 Gestational diabetes mellitus in pregnancy, unspecified control: Secondary | ICD-10-CM | POA: Diagnosis not present

## 2021-03-11 DIAGNOSIS — Z8632 Personal history of gestational diabetes: Secondary | ICD-10-CM

## 2021-03-12 ENCOUNTER — Other Ambulatory Visit: Payer: Self-pay | Admitting: *Deleted

## 2021-03-12 DIAGNOSIS — O09522 Supervision of elderly multigravida, second trimester: Secondary | ICD-10-CM

## 2021-04-08 ENCOUNTER — Ambulatory Visit: Payer: BC Managed Care – PPO | Admitting: *Deleted

## 2021-04-08 ENCOUNTER — Encounter: Payer: Self-pay | Admitting: *Deleted

## 2021-04-08 ENCOUNTER — Ambulatory Visit: Payer: BC Managed Care – PPO | Attending: Maternal & Fetal Medicine

## 2021-04-08 ENCOUNTER — Other Ambulatory Visit: Payer: Self-pay

## 2021-04-08 VITALS — BP 134/74 | HR 77

## 2021-04-08 DIAGNOSIS — O09522 Supervision of elderly multigravida, second trimester: Secondary | ICD-10-CM | POA: Diagnosis present

## 2021-04-08 DIAGNOSIS — O09523 Supervision of elderly multigravida, third trimester: Secondary | ICD-10-CM | POA: Insufficient documentation

## 2021-04-09 ENCOUNTER — Other Ambulatory Visit: Payer: Self-pay | Admitting: Maternal & Fetal Medicine

## 2021-04-09 DIAGNOSIS — O09522 Supervision of elderly multigravida, second trimester: Secondary | ICD-10-CM

## 2021-04-09 DIAGNOSIS — O99213 Obesity complicating pregnancy, third trimester: Secondary | ICD-10-CM

## 2021-04-09 DIAGNOSIS — O09299 Supervision of pregnancy with other poor reproductive or obstetric history, unspecified trimester: Secondary | ICD-10-CM

## 2021-05-19 ENCOUNTER — Other Ambulatory Visit: Payer: Self-pay | Admitting: Maternal & Fetal Medicine

## 2021-05-19 ENCOUNTER — Other Ambulatory Visit: Payer: Self-pay | Admitting: *Deleted

## 2021-05-19 ENCOUNTER — Other Ambulatory Visit: Payer: Self-pay

## 2021-05-19 ENCOUNTER — Encounter: Payer: Self-pay | Admitting: *Deleted

## 2021-05-19 ENCOUNTER — Ambulatory Visit: Payer: BC Managed Care – PPO | Admitting: *Deleted

## 2021-05-19 ENCOUNTER — Ambulatory Visit: Payer: BC Managed Care – PPO | Attending: Maternal & Fetal Medicine

## 2021-05-19 VITALS — BP 133/78 | HR 110

## 2021-05-19 DIAGNOSIS — O09299 Supervision of pregnancy with other poor reproductive or obstetric history, unspecified trimester: Secondary | ICD-10-CM

## 2021-05-19 DIAGNOSIS — O09523 Supervision of elderly multigravida, third trimester: Secondary | ICD-10-CM

## 2021-05-19 DIAGNOSIS — O09522 Supervision of elderly multigravida, second trimester: Secondary | ICD-10-CM

## 2021-05-19 DIAGNOSIS — O99213 Obesity complicating pregnancy, third trimester: Secondary | ICD-10-CM | POA: Diagnosis present

## 2021-05-25 ENCOUNTER — Other Ambulatory Visit: Payer: Self-pay

## 2021-05-25 ENCOUNTER — Ambulatory Visit: Payer: BC Managed Care – PPO | Attending: Obstetrics

## 2021-05-25 ENCOUNTER — Encounter: Payer: Self-pay | Admitting: *Deleted

## 2021-05-25 ENCOUNTER — Ambulatory Visit: Payer: BC Managed Care – PPO | Admitting: *Deleted

## 2021-05-25 VITALS — BP 135/95 | HR 81

## 2021-05-25 DIAGNOSIS — O24113 Pre-existing diabetes mellitus, type 2, in pregnancy, third trimester: Secondary | ICD-10-CM

## 2021-05-25 DIAGNOSIS — O09523 Supervision of elderly multigravida, third trimester: Secondary | ICD-10-CM | POA: Diagnosis present

## 2021-05-26 ENCOUNTER — Other Ambulatory Visit: Payer: Self-pay | Admitting: Maternal & Fetal Medicine

## 2021-05-26 DIAGNOSIS — O99213 Obesity complicating pregnancy, third trimester: Secondary | ICD-10-CM

## 2021-05-26 DIAGNOSIS — O09299 Supervision of pregnancy with other poor reproductive or obstetric history, unspecified trimester: Secondary | ICD-10-CM

## 2021-05-26 DIAGNOSIS — O09522 Supervision of elderly multigravida, second trimester: Secondary | ICD-10-CM

## 2021-06-01 ENCOUNTER — Ambulatory Visit: Payer: BC Managed Care – PPO | Admitting: *Deleted

## 2021-06-01 ENCOUNTER — Other Ambulatory Visit: Payer: Self-pay

## 2021-06-01 ENCOUNTER — Ambulatory Visit: Payer: BC Managed Care – PPO | Attending: Obstetrics

## 2021-06-01 ENCOUNTER — Encounter: Payer: Self-pay | Admitting: *Deleted

## 2021-06-01 VITALS — BP 133/90 | HR 91

## 2021-06-01 DIAGNOSIS — O09523 Supervision of elderly multigravida, third trimester: Secondary | ICD-10-CM | POA: Diagnosis not present

## 2021-06-02 ENCOUNTER — Telehealth (HOSPITAL_COMMUNITY): Payer: Self-pay | Admitting: *Deleted

## 2021-06-02 NOTE — Telephone Encounter (Signed)
Preadmission screen  

## 2021-06-02 NOTE — Patient Instructions (Signed)
[name]  06/02/2021   Your procedure is scheduled on:  06/16/2021  Arrive at [place] at Entrance C on [address] at [facility]  and [address]. You are invited to use the FREE valet parking or use the Visitor's parking deck.  Pick up the phone at the desk and dial [phone].  Call this number if you have problems the morning of surgery: [phone]  Remember:   Do not eat food:(After Midnight) Desps de medianoche.  Do not drink clear liquids: (After Midnight) Desps de medianoche.  Take these medicines the morning of surgery with A SIP OF WATER:  none   Do not wear jewelry, make-up or nail polish.  Do not wear lotions, powders, or perfumes. Do not wear deodorant.  Do not shave 48 hours prior to surgery.  Do not bring valuables to the hospital.  [facility] is not   responsible for any belongings or valuables brought to the hospital.  Contacts, dentures or bridgework may not be worn into surgery.  Leave suitcase in the car. After surgery it may be brought to your room.  For patients admitted to the hospital, checkout time is 11:00 AM the day of              discharge.      Please read over the following fact sheets that you were given:     Preparing for Surgery

## 2021-06-03 ENCOUNTER — Telehealth (HOSPITAL_COMMUNITY): Payer: Self-pay | Admitting: *Deleted

## 2021-06-03 NOTE — Telephone Encounter (Signed)
Preadmission screen  

## 2021-06-03 NOTE — H&P (Signed)
HPI: 35 y.o. [id number] @ [redacted]w[redacted]d estimated gestational age (as dated by LMP c/w 8 week ultrasound) presents for repeat cesarean section (history of C/S x 2, desires repeat) due to chronic HTN.  Leakage of fluid:  No Vaginal bleeding:  No Contractions:  No Fetal movement:  Yes  Prenatal care has been provided by Dr. Drema [name] [facility])  ROS:  Denies fevers, chills, chest pain, visual changes, SOB, RUQ/epigastric pain, N/V, dysuria, hematuria, or sudden onset/worsening bilateral LE or facial edema.  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 (subsequent demise at just below 1 yr) Chronic HTN History of C/S x  2 Pre-diabetes Obesity (BMI 17) History of gestational DM History of gestational HTN AMA (35)  Prenatal Transfer Tool  Maternal Diabetes: No Genetic Screening: Declined Maternal Ultrasounds/Referrals: Normal Fetal Ultrasounds or other Referrals:  Referred to Materal Fetal Medicine  Maternal Substance Abuse:  No Significant Maternal Medications:  Meds include: Other: Metformin 500mg  BID Significant Maternal Lab Results: Group B Strep negative   Prenatal Labs Blood type:  O Positive Antibody screen:  Negative CBC:  H/H 10.0/30.2 Rubella: Immune RPR:  Non-reactive Hep B:  Negative Hep C:  Negative HIV:  Negative GC/CT:  Negative Glucola:  123.8 (wnl)  Immunizations: Tdap: Given prenatally  OBHx:  OB History     Gravida  4   Para  2   Term  1   Preterm  1   AB  1   Living  2      SAB  1   IAB      Ectopic      Multiple  0   Live Births  2          PMHx:  See above Meds:  PNV, Metformin 500mg  BID Allergy:   Allergies  Allergen Reactions   Penicillins Other (See Comments)    Has patient had a PCN reaction causing immediate rash, facial/tongue/throat swelling, SOB or lightheadedness with hypotension: No Has patient had a PCN reaction causing severe rash involving mucus membranes or skin necrosis:  No Has patient had a PCN reaction that required hospitalization No Has patient had a PCN reaction occurring within the last 10 years: No If all of the above answers are "NO", then may proceed with Cephalosporin use. *causes too much sleep   SurgHx: History of CS x 2, Cyst on right foot removed SocHx:   Denies Tobacco, ETOH, illicit drugs  O: LMP 14/43/1540  Gen. AAOx3, NAD CV.  RRR  Resp. CTAB, no wheezes/rales/rhonchi Abd. Gravid, soft, non-tender throughout, no rebound/guarding Extr.  No bilateral LE edema, no calf tenderness bilaterally  Last [place]: (06/10/21): EFW 2810g (08%), AAFV, cephalic, posterior placenta, BPP 8/8    Labs: see orders  A/P:  35 y.o. [id number] @ [redacted]w[redacted]d who is admitted for repeat cesarean seciton.  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  - Subsequent demise just below 1yo - During pregnancy in 2014 - infant delivered via CS (positive CST, fetal distress) at 32 weeks for severe IUGR and intermittent reversal of flow with NRFHT - Panorama offered, patient considering -  declines - Declined amniocentesis offered by MFM - Serial growth [place] this pregnancy by MFM - see above for last US - Fetal Echo - referred by MFM to [facility] per formed on 5/18 - Normal, per MFM  Chronic HTN - Initial BP at 13 weeks 138/98 and at 28 weeks 124/90 - Suspect cHTN given risk factors and history - Medications:  None - HELLP labs and P/C ratio (6/10):  HELLP labs neg, P/C ratio 133mg /g  History of CS x 2 - Desires repeat  Pre-diabetes - Medication: Metformin 500mg  BID - Suspect pre-gestational DM, will need 2h GTT postpartum  Obesity (BMI 42) - SCDs while in bed -  Encourage frequent ambulation postpartum  AMA (35) - Declined genetic screening this rpegnancy  Consents: I have explained to the patient that this surgery is performed to deliver their baby or babies through an incision in the abdomen and incision in the uterus.  Prior to surgery, the risks and benefits of the surgery, as well as alternative treatments were discussed.  The risks include, but are not limited to, possible need for cesarean delivery for all future pregnancies, bleeding at the time of surgery that could necessitate a blood transfusion and/or hysterectomy, rupture of the uterus during a future pregnancy that could cause a preterm delivery and/or requiring hysterectomy, infection, damage to surrounding organs and tissues, damage to bladder, damage to ureters, causing kidney damage, and requiring additional procedures, damage to bowels, resulting in further surgery, postoperative pain, short-term and long-term, scarring on the abdominal wall and intra-abdominally, need for further surgery, development of an incisional hernia, deep vein thrombosis and/or pulmonary embolism, wound infection and/or separation, painful intercourse, urinary leakage, impact on future pregnancies including but not limited to, abnormal location or attachment of the placenta to the uterus, such as placenta previa or accreta, that may necessitate a blood transfusion and/or hysterectomy, impact on total family size, complications the course of which cannot be predicted or prevented, and death. Patient was consented for blood products.  The patient is aware that bleeding may result in the need for a blood transfusion which includes risk of transmission of HIV (1:2 million), Hepatitis C (1:2 million), and Hepatitis B (1:200 thousand) and transfusion reaction.  Patient voiced understanding of the above risks as well as understanding of indications for blood transfusion.   Wendy Dallas, DO [phone] (office)

## 2021-06-04 ENCOUNTER — Telehealth (HOSPITAL_COMMUNITY): Payer: Self-pay | Admitting: *Deleted

## 2021-06-04 NOTE — Telephone Encounter (Signed)
Preadmission screen  

## 2021-06-09 ENCOUNTER — Encounter: Payer: Self-pay | Admitting: *Deleted

## 2021-06-09 ENCOUNTER — Ambulatory Visit: Payer: BC Managed Care – PPO

## 2021-06-09 ENCOUNTER — Telehealth (HOSPITAL_COMMUNITY): Payer: Self-pay | Admitting: *Deleted

## 2021-06-09 ENCOUNTER — Ambulatory Visit: Payer: BC Managed Care – PPO | Attending: Obstetrics | Admitting: *Deleted

## 2021-06-09 ENCOUNTER — Other Ambulatory Visit: Payer: Self-pay

## 2021-06-09 VITALS — BP 143/84 | HR 77

## 2021-06-09 DIAGNOSIS — O24113 Pre-existing diabetes mellitus, type 2, in pregnancy, third trimester: Secondary | ICD-10-CM

## 2021-06-09 DIAGNOSIS — O09523 Supervision of elderly multigravida, third trimester: Secondary | ICD-10-CM | POA: Diagnosis not present

## 2021-06-09 NOTE — Telephone Encounter (Signed)
Preadmission screen  

## 2021-06-10 ENCOUNTER — Encounter (HOSPITAL_COMMUNITY): Payer: Self-pay

## 2021-06-10 ENCOUNTER — Ambulatory Visit: Payer: BC Managed Care – PPO | Attending: Obstetrics

## 2021-06-10 ENCOUNTER — Other Ambulatory Visit: Payer: Self-pay | Admitting: Maternal & Fetal Medicine

## 2021-06-10 ENCOUNTER — Ambulatory Visit: Payer: BC Managed Care – PPO

## 2021-06-10 DIAGNOSIS — O09293 Supervision of pregnancy with other poor reproductive or obstetric history, third trimester: Secondary | ICD-10-CM | POA: Insufficient documentation

## 2021-06-10 DIAGNOSIS — O24313 Unspecified pre-existing diabetes mellitus in pregnancy, third trimester: Secondary | ICD-10-CM | POA: Insufficient documentation

## 2021-06-10 DIAGNOSIS — Z3A37 37 weeks gestation of pregnancy: Secondary | ICD-10-CM | POA: Insufficient documentation

## 2021-06-10 DIAGNOSIS — O09523 Supervision of elderly multigravida, third trimester: Secondary | ICD-10-CM | POA: Insufficient documentation

## 2021-06-15 ENCOUNTER — Other Ambulatory Visit: Payer: Self-pay

## 2021-06-15 ENCOUNTER — Encounter (HOSPITAL_COMMUNITY)
Admission: RE | Admit: 2021-06-15 | Discharge: 2021-06-15 | Disposition: A | Discharge status: Home / Self Care | Payer: BC Managed Care – PPO | Source: Ambulatory Visit | Attending: Obstetrics and Gynecology | Admitting: Obstetrics and Gynecology

## 2021-06-15 DIAGNOSIS — Z01812 Encounter for preprocedural laboratory examination: Secondary | ICD-10-CM | POA: Insufficient documentation

## 2021-06-15 HISTORY — DX: Supervision of pregnancy with other poor reproductive or obstetric history, unspecified trimester: O09.299

## 2021-06-15 LAB — CBC WITH DIFFERENTIAL/PLATELET
Abs Immature Granulocytes: 0.03 10*3/uL (ref 0.00–0.07)
Basophils Absolute: 0 10*3/uL (ref 0.0–0.1)
Basophils Relative: 0 %
Eosinophils Absolute: 0.1 10*3/uL (ref 0.0–0.5)
Eosinophils Relative: 1 %
HCT: 35.2 % — ABNORMAL LOW (ref 36.0–46.0)
Hemoglobin: 11.2 g/dL — ABNORMAL LOW (ref 12.0–15.0)
Immature Granulocytes: 1 %
Lymphocytes Relative: 32 %
Lymphs Abs: 2.1 10*3/uL (ref 0.7–4.0)
MCH: 25.1 pg — ABNORMAL LOW (ref 26.0–34.0)
MCHC: 31.8 g/dL (ref 30.0–36.0)
MCV: 78.7 fL — ABNORMAL LOW (ref 80.0–100.0)
Monocytes Absolute: 0.5 10*3/uL (ref 0.1–1.0)
Monocytes Relative: 8 %
Neutro Abs: 3.8 10*3/uL (ref 1.7–7.7)
Neutrophils Relative %: 58 %
Platelets: 327 10*3/uL (ref 150–400)
RBC: 4.47 MIL/uL (ref 3.87–5.11)
RDW: 15.9 % — ABNORMAL HIGH (ref 11.5–15.5)
WBC: 6.5 10*3/uL (ref 4.0–10.5)
nRBC: 0 % (ref 0.0–0.2)

## 2021-06-15 LAB — TYPE AND SCREEN
ABO/RH(D): O POS
Antibody Screen: NEGATIVE

## 2021-06-16 ENCOUNTER — Encounter (HOSPITAL_COMMUNITY): Payer: Self-pay | Admitting: Obstetrics and Gynecology

## 2021-06-16 ENCOUNTER — Inpatient Hospital Stay (HOSPITAL_COMMUNITY): Payer: BC Managed Care – PPO | Admitting: Certified Registered Nurse Anesthetist

## 2021-06-16 ENCOUNTER — Inpatient Hospital Stay (HOSPITAL_COMMUNITY)
Admission: RE | Admit: 2021-06-16 | Discharge: 2021-06-18 | DRG: 787 | Disposition: A | Discharge status: Home / Self Care | Payer: BC Managed Care – PPO | Attending: Obstetrics and Gynecology | Admitting: Obstetrics and Gynecology

## 2021-06-16 ENCOUNTER — Encounter (HOSPITAL_COMMUNITY)
Admission: RE | Disposition: A | Discharge status: Home / Self Care | Payer: Self-pay | Source: Home / Self Care | Attending: Obstetrics and Gynecology

## 2021-06-16 DIAGNOSIS — Z98891 History of uterine scar from previous surgery: Secondary | ICD-10-CM

## 2021-06-16 DIAGNOSIS — O34211 Maternal care for low transverse scar from previous cesarean delivery: Secondary | ICD-10-CM | POA: Diagnosis present

## 2021-06-16 DIAGNOSIS — O1002 Pre-existing essential hypertension complicating childbirth: Secondary | ICD-10-CM | POA: Diagnosis present

## 2021-06-16 DIAGNOSIS — O99214 Obesity complicating childbirth: Secondary | ICD-10-CM | POA: Diagnosis present

## 2021-06-16 DIAGNOSIS — Z20822 Contact with and (suspected) exposure to covid-19: Secondary | ICD-10-CM | POA: Diagnosis present

## 2021-06-16 DIAGNOSIS — Z3A38 38 weeks gestation of pregnancy: Secondary | ICD-10-CM | POA: Diagnosis not present

## 2021-06-16 DIAGNOSIS — R7303 Prediabetes: Secondary | ICD-10-CM | POA: Diagnosis present

## 2021-06-16 DIAGNOSIS — O99892 Other specified diseases and conditions complicating childbirth: Secondary | ICD-10-CM | POA: Diagnosis present

## 2021-06-16 LAB — COMPREHENSIVE METABOLIC PANEL
ALT: 19 U/L (ref 0–44)
AST: 18 U/L (ref 15–41)
Albumin: 2.5 g/dL — ABNORMAL LOW (ref 3.5–5.0)
Alkaline Phosphatase: 69 U/L (ref 38–126)
Anion gap: 8 (ref 5–15)
BUN: 8 mg/dL (ref 6–20)
CO2: 22 mmol/L (ref 22–32)
Calcium: 8.9 mg/dL (ref 8.9–10.3)
Chloride: 106 mmol/L (ref 98–111)
Creatinine, Ser: 0.56 mg/dL (ref 0.44–1.00)
GFR, Estimated: >60 mL/min (ref >60)
Glucose, Bld: 101 mg/dL — ABNORMAL HIGH (ref 70–99)
Potassium: 4 mmol/L (ref 3.5–5.1)
Sodium: 136 mmol/L (ref 135–145)
Total Bilirubin: 0.5 mg/dL (ref 0.3–1.2)
Total Protein: 5.7 g/dL — ABNORMAL LOW (ref 6.5–8.1)

## 2021-06-16 LAB — PROTEIN / CREATININE RATIO, URINE
Creatinine, Urine: 82.95 mg/dL
Total Protein, Urine: <6 mg/dL

## 2021-06-16 LAB — RPR: RPR Ser Ql: NONREACTIVE

## 2021-06-16 LAB — RESP PANEL BY RT-PCR (FLU A&B, COVID) ARPGX2
Influenza A by PCR: NEGATIVE
Influenza B by PCR: NEGATIVE
SARS Coronavirus 2 by RT PCR: NEGATIVE

## 2021-06-16 LAB — GLUCOSE, CAPILLARY: Glucose-Capillary: 111 mg/dL — ABNORMAL HIGH (ref 70–99)

## 2021-06-16 SURGERY — Surgical Case
Anesthesia: Spinal | Wound class: Clean Contaminated

## 2021-06-16 MED ORDER — ACETAMINOPHEN 500 MG PO TABS: 1000.0000 mg | ORAL_TABLET | Freq: Four times a day (QID) | ORAL | Status: DC

## 2021-06-16 MED ORDER — ACETAMINOPHEN 10 MG/ML IV SOLN
INTRAVENOUS | Status: DC | PRN
  Administered 2021-06-16: 1000 mg via INTRAVENOUS

## 2021-06-16 MED ORDER — SODIUM CHLORIDE 0.9% FLUSH: 3.0000 mL | INTRAVENOUS | Status: DC | PRN

## 2021-06-16 MED ORDER — HYDROMORPHONE HCL 1 MG/ML IJ SOLN: 0.2500 mg | INTRAMUSCULAR | Status: DC | PRN

## 2021-06-16 MED ORDER — NALOXONE HCL 4 MG/10ML IJ SOLN
1.0000 ug/kg/h | INTRAVENOUS | Status: DC | PRN
  Filled 2021-06-16: qty 5

## 2021-06-16 MED ORDER — LACTATED RINGERS IV SOLN: INTRAVENOUS | Status: DC

## 2021-06-16 MED ORDER — NALBUPHINE HCL 10 MG/ML IJ SOLN: 5.0000 mg | INTRAMUSCULAR | Status: DC | PRN

## 2021-06-16 MED ORDER — MORPHINE SULFATE (PF) 0.5 MG/ML IJ SOLN
INTRAMUSCULAR | Status: DC | PRN
  Administered 2021-06-16: .15 mg via INTRATHECAL

## 2021-06-16 MED ORDER — KETOROLAC TROMETHAMINE 30 MG/ML IJ SOLN
30.0000 mg | Freq: Once | INTRAMUSCULAR | Status: AC | PRN
  Administered 2021-06-16: 30 mg via INTRAVENOUS

## 2021-06-16 MED ORDER — MENTHOL 3 MG MT LOZG
1.0000 | LOZENGE | OROMUCOSAL | Status: DC | PRN
Start: 2021-06-16 — End: 2021-06-18

## 2021-06-16 MED ORDER — SIMETHICONE 80 MG PO CHEW
80.0000 mg | CHEWABLE_TABLET | ORAL | Status: DC | PRN
  Administered 2021-06-18: 80 mg via ORAL
  Filled 2021-06-16: qty 1

## 2021-06-16 MED ORDER — NALOXONE HCL 0.4 MG/ML IJ SOLN: 0.4000 mg | INTRAMUSCULAR | Status: DC | PRN

## 2021-06-16 MED ORDER — KETOROLAC TROMETHAMINE 30 MG/ML IJ SOLN
30.0000 mg | Freq: Four times a day (QID) | INTRAMUSCULAR | Status: AC
Start: 2021-06-16 — End: 2021-06-17
  Administered 2021-06-16 – 2021-06-17 (×3): 30 mg via INTRAVENOUS
  Filled 2021-06-16 (×3): qty 1

## 2021-06-16 MED ORDER — CEFAZOLIN IN SODIUM CHLORIDE 3-0.9 GM/100ML-% IV SOLN
3.0000 g | INTRAVENOUS | Status: AC
  Administered 2021-06-16: 3 g via INTRAVENOUS

## 2021-06-16 MED ORDER — COCONUT OIL OIL: 1.0000 "application " | TOPICAL_OIL | Status: DC | PRN

## 2021-06-16 MED ORDER — MEPERIDINE HCL 25 MG/ML IJ SOLN: 6.2500 mg | INTRAMUSCULAR | Status: DC | PRN

## 2021-06-16 MED ORDER — SOD CITRATE-CITRIC ACID 500-334 MG/5ML PO SOLN
30.0000 mL | ORAL | Status: AC
  Administered 2021-06-16: 30 mL via ORAL

## 2021-06-16 MED ORDER — KETOROLAC TROMETHAMINE 30 MG/ML IJ SOLN
INTRAMUSCULAR | Status: AC
  Filled 2021-06-16: qty 1

## 2021-06-16 MED ORDER — DIPHENHYDRAMINE HCL 25 MG PO CAPS: 25.0000 mg | ORAL_CAPSULE | Freq: Four times a day (QID) | ORAL | Status: DC | PRN

## 2021-06-16 MED ORDER — PHENYLEPHRINE HCL-NACL 20-0.9 MG/250ML-% IV SOLN
INTRAVENOUS | Status: DC | PRN
  Administered 2021-06-16: 60 ug/min via INTRAVENOUS

## 2021-06-16 MED ORDER — LACTATED RINGERS IV SOLN: INTRAVENOUS | Status: DC | PRN

## 2021-06-16 MED ORDER — GLYCOPYRROLATE 0.2 MG/ML IJ SOLN
INTRAMUSCULAR | Status: DC | PRN
  Administered 2021-06-16: .2 mg via INTRAVENOUS

## 2021-06-16 MED ORDER — SODIUM CHLORIDE 0.9 % IR SOLN
Status: DC | PRN
  Administered 2021-06-16: 1000 mL

## 2021-06-16 MED ORDER — DEXAMETHASONE SODIUM PHOSPHATE 4 MG/ML IJ SOLN
INTRAMUSCULAR | Status: AC
  Filled 2021-06-16: qty 1

## 2021-06-16 MED ORDER — SENNOSIDES-DOCUSATE SODIUM 8.6-50 MG PO TABS
2.0000 | ORAL_TABLET | Freq: Every day | ORAL | Status: DC
  Administered 2021-06-17 – 2021-06-18 (×2): 2 via ORAL
  Filled 2021-06-16 (×2): qty 2

## 2021-06-16 MED ORDER — CEFAZOLIN IN SODIUM CHLORIDE 3-0.9 GM/100ML-% IV SOLN
INTRAVENOUS | Status: AC
  Filled 2021-06-16: qty 100

## 2021-06-16 MED ORDER — DEXAMETHASONE SODIUM PHOSPHATE 4 MG/ML IJ SOLN
INTRAMUSCULAR | Status: DC | PRN
  Administered 2021-06-16: 5 mg via INTRAVENOUS

## 2021-06-16 MED ORDER — KETOROLAC TROMETHAMINE 30 MG/ML IJ SOLN: 30.0000 mg | Freq: Four times a day (QID) | INTRAMUSCULAR | Status: DC | PRN

## 2021-06-16 MED ORDER — NALBUPHINE HCL 10 MG/ML IJ SOLN: 5.0000 mg | Freq: Once | INTRAMUSCULAR | Status: DC | PRN

## 2021-06-16 MED ORDER — OXYCODONE HCL 5 MG PO TABS
5.0000 mg | ORAL_TABLET | ORAL | Status: DC | PRN
  Administered 2021-06-17: 5 mg via ORAL
  Administered 2021-06-17 (×2): 10 mg via ORAL
  Administered 2021-06-17 – 2021-06-18 (×2): 5 mg via ORAL
  Filled 2021-06-16: qty 2
  Filled 2021-06-16: qty 1
  Filled 2021-06-16 (×2): qty 2
  Filled 2021-06-16 (×2): qty 1

## 2021-06-16 MED ORDER — ENOXAPARIN SODIUM 60 MG/0.6ML IJ SOSY
60.0000 mg | PREFILLED_SYRINGE | INTRAMUSCULAR | Status: DC
  Administered 2021-06-16 – 2021-06-17 (×2): 60 mg via SUBCUTANEOUS
  Filled 2021-06-16 (×2): qty 0.6

## 2021-06-16 MED ORDER — ACETAMINOPHEN 500 MG PO TABS
1000.0000 mg | ORAL_TABLET | Freq: Four times a day (QID) | ORAL | Status: DC
  Administered 2021-06-16 – 2021-06-18 (×8): 1000 mg via ORAL
  Filled 2021-06-16 (×8): qty 2

## 2021-06-16 MED ORDER — DIBUCAINE (PERIANAL) 1 % EX OINT: 1.0000 "application " | TOPICAL_OINTMENT | CUTANEOUS | Status: DC | PRN

## 2021-06-16 MED ORDER — OXYTOCIN-SODIUM CHLORIDE 30-0.9 UT/500ML-% IV SOLN: 2.5000 [IU]/h | INTRAVENOUS | Status: AC

## 2021-06-16 MED ORDER — ONDANSETRON HCL 4 MG/2ML IJ SOLN
INTRAMUSCULAR | Status: AC
  Filled 2021-06-16: qty 2

## 2021-06-16 MED ORDER — IBUPROFEN 600 MG PO TABS
600.0000 mg | ORAL_TABLET | Freq: Four times a day (QID) | ORAL | Status: DC
  Administered 2021-06-17 – 2021-06-18 (×4): 600 mg via ORAL
  Filled 2021-06-16 (×4): qty 1

## 2021-06-16 MED ORDER — BUPIVACAINE IN DEXTROSE 0.75-8.25 % IT SOLN
INTRATHECAL | Status: DC | PRN
  Administered 2021-06-16: 1.4 mL via INTRATHECAL

## 2021-06-16 MED ORDER — MORPHINE SULFATE (PF) 0.5 MG/ML IJ SOLN
INTRAMUSCULAR | Status: AC
  Filled 2021-06-16: qty 10

## 2021-06-16 MED ORDER — ZOLPIDEM TARTRATE 5 MG PO TABS: 5.0000 mg | ORAL_TABLET | Freq: Every evening | ORAL | Status: DC | PRN

## 2021-06-16 MED ORDER — OXYTOCIN-SODIUM CHLORIDE 30-0.9 UT/500ML-% IV SOLN
INTRAVENOUS | Status: DC | PRN
  Administered 2021-06-16 (×2): 150 mL via INTRAVENOUS

## 2021-06-16 MED ORDER — SIMETHICONE 80 MG PO CHEW
80.0000 mg | CHEWABLE_TABLET | Freq: Three times a day (TID) | ORAL | Status: DC
  Administered 2021-06-16 – 2021-06-18 (×6): 80 mg via ORAL
  Filled 2021-06-16 (×6): qty 1

## 2021-06-16 MED ORDER — PRENATAL MULTIVITAMIN CH
1.0000 | ORAL_TABLET | Freq: Every day | ORAL | Status: DC
  Administered 2021-06-16 – 2021-06-18 (×3): 1 via ORAL
  Filled 2021-06-16 (×3): qty 1

## 2021-06-16 MED ORDER — DIPHENHYDRAMINE HCL 25 MG PO CAPS: 25.0000 mg | ORAL_CAPSULE | ORAL | Status: DC | PRN

## 2021-06-16 MED ORDER — NIFEDIPINE ER OSMOTIC RELEASE 30 MG PO TB24
30.0000 mg | ORAL_TABLET | Freq: Every day | ORAL | Status: DC
  Administered 2021-06-16 – 2021-06-18 (×3): 30 mg via ORAL
  Filled 2021-06-16 (×3): qty 1

## 2021-06-16 MED ORDER — STERILE WATER FOR IRRIGATION IR SOLN
Status: DC | PRN
  Administered 2021-06-16: 1000 mL

## 2021-06-16 MED ORDER — DIPHENHYDRAMINE HCL 50 MG/ML IJ SOLN: 12.5000 mg | INTRAMUSCULAR | Status: DC | PRN

## 2021-06-16 MED ORDER — ONDANSETRON HCL 4 MG/2ML IJ SOLN
INTRAMUSCULAR | Status: DC | PRN
  Administered 2021-06-16: 4 mg via INTRAVENOUS

## 2021-06-16 MED ORDER — WITCH HAZEL-GLYCERIN EX PADS: 1.0000 "application " | MEDICATED_PAD | CUTANEOUS | Status: DC | PRN

## 2021-06-16 MED ORDER — MORPHINE SULFATE (PF) 2 MG/ML IV SOLN: 1.0000 mg | INTRAVENOUS | Status: DC | PRN

## 2021-06-16 MED ORDER — FENTANYL CITRATE (PF) 100 MCG/2ML IJ SOLN
INTRAMUSCULAR | Status: DC | PRN
  Administered 2021-06-16: 15 ug via INTRATHECAL

## 2021-06-16 MED ORDER — PROMETHAZINE HCL 25 MG/ML IJ SOLN: 6.2500 mg | INTRAMUSCULAR | Status: DC | PRN

## 2021-06-16 MED ORDER — SOD CITRATE-CITRIC ACID 500-334 MG/5ML PO SOLN
ORAL | Status: AC
  Filled 2021-06-16: qty 30

## 2021-06-16 MED ORDER — ONDANSETRON HCL 4 MG/2ML IJ SOLN: 4.0000 mg | Freq: Three times a day (TID) | INTRAMUSCULAR | Status: DC | PRN

## 2021-06-16 MED ORDER — FENTANYL CITRATE (PF) 100 MCG/2ML IJ SOLN
INTRAMUSCULAR | Status: AC
  Filled 2021-06-16: qty 2

## 2021-06-16 MED ORDER — SCOPOLAMINE 1 MG/3DAYS TD PT72: 1.0000 | MEDICATED_PATCH | Freq: Once | TRANSDERMAL | Status: DC

## 2021-06-16 SURGICAL SUPPLY — 41 items
BENZOIN TINCTURE PRP APPL 2/3 (GAUZE/BANDAGES/DRESSINGS) ×2 IMPLANT
CHLORAPREP W/TINT 26 (MISCELLANEOUS) ×2 IMPLANT
CLAMP CORD UMBIL (MISCELLANEOUS) IMPLANT
CLOTH BEACON ORANGE TIMEOUT ST (SAFETY) ×2 IMPLANT
DRAPE C SECTION CLR SCREEN (DRAPES) ×2 IMPLANT
DRESSING PREVENA PLUS CUSTOM (GAUZE/BANDAGES/DRESSINGS) ×1 IMPLANT
DRSG OPSITE POSTOP 4X10 (GAUZE/BANDAGES/DRESSINGS) ×2 IMPLANT
DRSG PREVENA PLUS CUSTOM (GAUZE/BANDAGES/DRESSINGS) ×2
ELECT REM PT RETURN 9FT ADLT (ELECTROSURGICAL) ×2
ELECTRODE REM PT RTRN 9FT ADLT (ELECTROSURGICAL) ×1 IMPLANT
EXTRACTOR VACUUM KIWI (MISCELLANEOUS) IMPLANT
GLOVE BIO SURGEON STRL SZ7 (GLOVE) ×2 IMPLANT
GLOVE BIOGEL PI IND STRL 7.0 (GLOVE) ×2 IMPLANT
GLOVE BIOGEL PI INDICATOR 7.0 (GLOVE) ×2
GLOVE SURG POLYISO LF SZ6.5 (GLOVE) ×2 IMPLANT
GOWN STRL REUS W/ TWL LRG LVL3 (GOWN DISPOSABLE) ×3 IMPLANT
GOWN STRL REUS W/TWL LRG LVL3 (GOWN DISPOSABLE) ×3
HEMOSTAT ARISTA ABSORB 3G PWDR (HEMOSTASIS) ×2 IMPLANT
HOVERMATT SINGLE USE (MISCELLANEOUS) ×2 IMPLANT
KIT ABG SYR 3ML LUER SLIP (SYRINGE) IMPLANT
NEEDLE HYPO 25X5/8 SAFETYGLIDE (NEEDLE) IMPLANT
NS IRRIG 1000ML POUR BTL (IV SOLUTION) ×2 IMPLANT
PACK C SECTION WH (CUSTOM PROCEDURE TRAY) ×2 IMPLANT
PAD OB MATERNITY 4.3X12.25 (PERSONAL CARE ITEMS) ×2 IMPLANT
PENCIL SMOKE EVAC W/HOLSTER (ELECTROSURGICAL) ×2 IMPLANT
RETRACTOR TRAXI PANNICULUS (MISCELLANEOUS) ×1 IMPLANT
RTRCTR C-SECT PINK 25CM LRG (MISCELLANEOUS) ×2 IMPLANT
STRIP CLOSURE SKIN 1/2X4 (GAUZE/BANDAGES/DRESSINGS) ×2 IMPLANT
SUT MNCRL 0 VIOLET CTX 36 (SUTURE) ×2 IMPLANT
SUT MNCRL+ AB 3-0 CT1 36 (SUTURE) ×2 IMPLANT
SUT MONOCRYL 0 CTX 36 (SUTURE) ×2
SUT MONOCRYL AB 3-0 CT1 36IN (SUTURE) ×2
SUT PDS AB 0 CTX 36 PDP370T (SUTURE) ×4 IMPLANT
SUT PLAIN 0 NONE (SUTURE) IMPLANT
SUT VIC AB 2-0 CT1 27 (SUTURE)
SUT VIC AB 2-0 CT1 TAPERPNT 27 (SUTURE) IMPLANT
SUT VIC AB 4-0 KS 27 (SUTURE) ×2 IMPLANT
TOWEL OR 17X24 6PK STRL BLUE (TOWEL DISPOSABLE) ×4 IMPLANT
TRAXI PANNICULUS RETRACTOR (MISCELLANEOUS) ×1
TRAY FOLEY W/BAG SLVR 14FR LF (SET/KITS/TRAYS/PACK) ×2 IMPLANT
WATER STERILE IRR 1000ML POUR (IV SOLUTION) ×2 IMPLANT

## 2021-06-16 NOTE — Interval H&P Note (Signed)
History and Physical Interval Note:  06/16/2021 7:17 AM  [name]  has presented today for surgery, with the diagnosis of Repeat Csection.  The various methods of treatment have been discussed with the patient and family. After consideration of risks, benefits and other options for treatment, the patient has consented to  Procedure(s): REPEAT CESAREAN SECTION (N/A) as a surgical intervention.  The patient's history has been reviewed, patient examined, no change in status, stable for surgery.  I have reviewed the patient's chart and labs.  Questions were answered to the patient's satisfaction.     [name]

## 2021-06-16 NOTE — Anesthesia Procedure Notes (Signed)
Spinal  Patient location during procedure: OR Start time: 06/16/2021 7:30 AM End time: 06/16/2021 7:34 AM Reason for block: surgical anesthesia Staffing Performed: anesthesiologist  Anesthesiologist: Lyn Hollingshead, MD Preanesthetic Checklist Completed: patient identified, IV checked, site marked, risks and benefits discussed, surgical consent, monitors and equipment checked, pre-op evaluation and timeout performed Spinal Block Patient position: sitting Prep: DuraPrep and site prepped and draped Patient monitoring: continuous pulse ox and blood pressure Approach: midline Location: L3-4 Injection technique: single-shot Needle Needle type: Pencan  Needle gauge: 24 G Needle length: 10 cm Needle insertion depth: 7 cm Assessment Sensory level: T4 Events: CSF return

## 2021-06-16 NOTE — Op Note (Signed)
Pre Op Dx:   1. Single live IUP at 36w2d2. Chronic Hypertension 3. History of cesarean section x 2, desires repeat  Post Op Dx:  Same as pre-op diagnoses  Procedure:  Low Transverse Cesarean Section  Surgeon:  Dr. MDrema DallasAssistants:  Dr. TChristophe LouisAnesthesia:  Spinal  EBL:  178cc  IVF:  2300cc crystalloid UOP:  200cc  Drains:  Foley catheter  Specimen removed:  Placenta - sent to pathology Device(s) implanted:  None Case Type:  Clean-contaminated Findings: Normal-appearing uterus, bilateral fallopian tubes, and ovaries. Dense fascial scarring. Thin lower uterine segment. Absence of pelvic adhesions. Fetus in cephalic position, double nuchal cord, and clear amniotic fluid. Complications: None Indications:  35y.o. [id number] 325w2dith chronic HTN and history of cesarean section x 2 who desired repeat. Procedure:  After informed consent was obtained, the patient was brought to the operating room.  Following administration of spinal anesthesia, the patient was positioned in dorsal supine position with a leftward tilt and was prepped and draped in sterile fashion.  A preoperative time-out was performed.  The abdomen was entered in layers through a pfannenstiel incision and a retractor was placed.  A low transverse hysterotomy was created sharply to the level of the membranes, then extended bluntly.  The fetus was delivered from cephalic presentation onto the field - double nuchal was reduced prior to delivery of the body.  Bulb suctioning was performed.  The cord was doubly clamped and cut after a 60 second pause.  The newborn was passed to the warmer.  The placenta was delivered.  The uterus was swept free of clots and debris and closed in a running locked fashion with 0-Monocryl.  Hemostasis was verified.  The abdomen was irrigated with warmed saline and cleared of clots.  Subfascial spaces were inspected and hemostasis assured. Arista was placed over the rectus muscle bellies due to  minimal oozing. The fascia was closed in a running fashion with 0-PDS.  The subcutaneous tissues were irrigated and hemostasis assured.  The subcutaneous tissues were closed with 3-0 Monocryl in a running layer.  The skin was closed with 4-0 Vicryl. Prevena wound system applied. The patient was transferred to PACU.  All needle, sponge, and instrument counts were correct at the end of the case.   Disposition:  PACU  I performed the procedure and the assistant was needed due to the complexity of the anatomy.  MeDrema DallasDO

## 2021-06-16 NOTE — Anesthesia Postprocedure Evaluation (Signed)
Anesthesia Post Note  Patient: [name]  Procedure(s) Performed: [place]     Patient location during evaluation: Mother Baby Anesthesia Type: Spinal Level of consciousness: oriented and awake and alert Pain management: pain level controlled Vital Signs Assessment: post-procedure vital signs reviewed and stable Respiratory status: spontaneous breathing and respiratory function stable Cardiovascular status: blood pressure returned to baseline and stable Postop Assessment: no headache, no backache, no apparent nausea or vomiting and able to ambulate Anesthetic complications: no   No notable events documented.  Last Vitals:  Vitals:   06/16/21 1015 06/16/21 1133  BP: (!) 133/92 (!) 144/86  Pulse: 79 75  Resp: 16 18  Temp: 36.8 C 37.1 C  SpO2: 100% 99%    Last Pain:  Vitals:   06/16/21 1254  TempSrc:   PainSc: 0-No pain                 Belenda Cruise P Kaylamarie Swickard

## 2021-06-16 NOTE — Anesthesia Preprocedure Evaluation (Signed)
Anesthesia Evaluation  Patient identified by MRN, date of birth, ID band Patient awake    Reviewed: Allergy & Precautions, NPO status , Patient's Chart, lab work & pertinent test results  History of Anesthesia Complications Negative for: history of anesthetic complications  Airway Mallampati: II  TM Distance: >3 FB Neck ROM: Full    Dental no notable dental hx. (+) Dental Advisory Given   Pulmonary    Pulmonary exam normal        Cardiovascular hypertension, Normal cardiovascular exam     Neuro/Psych negative neurological ROS  negative psych ROS   GI/Hepatic negative GI ROS, Neg liver ROS,   Endo/Other  diabetes, Gestational, Oral Hypoglycemic AgentsMorbid obesity  Renal/GU negative Renal ROS  negative genitourinary   Musculoskeletal negative musculoskeletal ROS (+)   Abdominal (+) + obese,   Peds negative pediatric ROS (+)  Hematology  (+) anemia ,   Anesthesia Other Findings   Reproductive/Obstetrics (+) Pregnancy                             Anesthesia Physical  Anesthesia Plan  ASA: III  Anesthesia Plan: Spinal   Post-op Pain Management:    Induction:   PONV Risk Score and Plan: 3 and Ondansetron, Dexamethasone and Scopolamine patch - Pre-op  Airway Management Planned: Natural Airway, Simple Face Mask and Nasal Cannula  Additional Equipment: None  Intra-op Plan:   Post-operative Plan:   Informed Consent: I have reviewed the patients History and Physical, chart, labs and discussed the procedure including the risks, benefits and alternatives for the proposed anesthesia with the patient or authorized representative who has indicated his/her understanding and acceptance.       Plan Discussed with: CRNA  Anesthesia Plan Comments:         Anesthesia Quick Evaluation

## 2021-06-16 NOTE — Transfer of Care (Signed)
Immediate Anesthesia Transfer of Care Note  Patient: [name]  Procedure(s) Performed: REPEAT CESAREAN SECTION  Patient Location: PACU  Anesthesia Type:Spinal  Level of Consciousness: awake, alert  and oriented  Airway & Oxygen Therapy: Patient Spontanous Breathing  Post-op Assessment: Report given to RN and Post -op Vital signs reviewed and stable  Post vital signs: Reviewed and stable  Last Vitals:  Vitals Value Taken Time  BP 115/74 06/16/21 0900  Temp 36.9 C 06/16/21 0858  Pulse 85 06/16/21 0904  Resp 18 06/16/21 0904  SpO2 100 % 06/16/21 0904  Vitals shown include unvalidated device data.  Last Pain:  Vitals:   06/16/21 0858  TempSrc: Oral  PainSc:          Complications: No notable events documented.

## 2021-06-16 NOTE — Progress Notes (Signed)
Called by RN RE: hypertensive BP values. Pt reports a hx of pre-eclampsia w/ two prev preg. Ordered CMP and urine protein/creatinine ratio. Will update Dr. Delora Fuel.   Burman Foster, MSN, CNM 06/16/2021 5:46 AM

## 2021-06-17 ENCOUNTER — Encounter (HOSPITAL_COMMUNITY): Payer: Self-pay | Admitting: Obstetrics and Gynecology

## 2021-06-17 LAB — CBC
HCT: 30.5 % — ABNORMAL LOW (ref 36.0–46.0)
Hemoglobin: 9.6 g/dL — ABNORMAL LOW (ref 12.0–15.0)
MCH: 25 pg — ABNORMAL LOW (ref 26.0–34.0)
MCHC: 31.5 g/dL (ref 30.0–36.0)
MCV: 79.4 fL — ABNORMAL LOW (ref 80.0–100.0)
Platelets: 269 10*3/uL (ref 150–400)
RBC: 3.84 MIL/uL — ABNORMAL LOW (ref 3.87–5.11)
RDW: 15.9 % — ABNORMAL HIGH (ref 11.5–15.5)
WBC: 7.4 10*3/uL (ref 4.0–10.5)
nRBC: 0 % (ref 0.0–0.2)

## 2021-06-17 LAB — SARS CORONAVIRUS 2 (TAT 6-24 HRS): SARS Coronavirus 2: NEGATIVE

## 2021-06-17 MED ORDER — IBUPROFEN 600 MG PO TABS: 600.0000 mg | ORAL_TABLET | Freq: Four times a day (QID) | ORAL | 3 refills | Status: AC | PRN

## 2021-06-17 MED ORDER — OXYCODONE HCL 5 MG PO TABS: 5.0000 mg | ORAL_TABLET | ORAL | 0 refills | Status: AC | PRN

## 2021-06-17 MED ORDER — NIFEDIPINE ER 30 MG PO TB24: 30.0000 mg | ORAL_TABLET | Freq: Every day | ORAL | 3 refills | Status: AC

## 2021-06-17 NOTE — Progress Notes (Signed)
Postpartum Note Day #1  S:  Patient doing well.  Pain controlled.  Tolerating regular diet.   Ambulating and voiding without difficulty. Currently breastfeeding. If infant discharged tomorrow, she would like to be discharged home. Denies fevers, chills, chest pain, SOB, N/V, or worsening bilateral LE edema.  Lochia: Minimal Infant feeding:  Breast Circumcision:  N/A, female infant Contraception:  None now, desires to decide by 6 week PPV  O: Temp:  [98.3 F (36.8 C)-98.7 F (37.1 C)] 98.3 F (36.8 C) (08/03 0600) Pulse Rate:  [59-96] 91 (08/03 0600) Resp:  [11-21] 16 (08/03 0600) BP: (114-149)/(74-103) 118/84 (08/03 0600) SpO2:  [94 %-100 %] 99 % (08/03 0600) Gen: NAD, pleasant and cooperative CV: RRR Resp: CTAB, no wheezes/rales/rhonchi anteriorly Abdomen: soft, non-distended, non-tender throughout Uterus: firm, non-tender, below umbilicus Incision: Prevena in place with adequate suction, no surrounding erythema Ext: No bilateral LE edema, no bilateral calf tenderness, SCDs on and working  Labs:  Recent Labs    06/15/21 0922 06/17/21 0700  HGB 11.2* 9.6*    A/P: Patient is a 35 y.o. [id number] POD#1 s/p repeat LTCS.  S/p LTCS - Pain well controlled  - GU: UOP is adequate and voiding spontaneously - GI: Tolerating regular diet - Activity: encouraged sitting up to chair and ambulation as tolerated - DVT Prophylaxis: SCDs while in bed, Lovenox prophylaxis, frequent ambulation - Labs: as above  Chronic HTN - Medications: Procardia XL '30mg'$  daily (started on 8/2) - Normotensive this morning  Pre-diabetes - Medication: Metformin '500mg'$  BID - Last CBG 111, will likely restart in the postpartum period  Obesity (BMI 42) - Prophylactic Lovenox ordered  Disposition:  D/C home POD#2-3.   [name], DO [phone] (office)

## 2021-06-17 NOTE — Social Work (Signed)
CSW received consult for history of infant loss and anxiety.  CSW met with MOB to offer support and complete assessment.    CSW met with MOB at bedside. CSW observed MOB holding and bonding with the infant. CSW congratulated MOB and introduced CSW role. MOB presented calm and receptive to CSW visit. MOB presented smiling, calm and receptive to CSW visit. CSW inquired how MOB has felt since giving birth. MOB reported feeling good and stated the birth went well. CSW inquired about MOB history of anxiety. MOB reported she has a history of anxiety however she has not been formally diagnosed. MOB described her symptoms as racing heart, racing thoughts and sweaty palms. MOB reported it happened often during pregnancy will she had blood pressure check. MOB reported she has not been to therapy or taken medication for anxiety, She has been able to self-manage though coping mechanism such as cleaning and walking to clear her thoughts. CSW praised MOB for her coping mechanisms. CSW inquired if MOB has experienced postpartum depression. MOB reported she has not experienced postpartum depression but was well informed of the symptoms. CSW provided education regarding the baby blues period vs. perinatal mood disorders, discussed treatment and gave resources for mental health follow up if concerns arise.  CSW recommended MOB complete a self-evaluation during the postpartum time period using the New Mom Checklist from Postpartum Progress and encouraged MOB to contact a medical professional if symptoms are noted at any time.  MOB reported she feels comfortable reaching out to her physician if concerns arise. CSW assessed MOB for safety. MOB denied thoughts of harm to self and others. CSW inquired about MOB supports. MOB identified her spouse, parents, siblings and in laws as supports.   MOB was very knowledgeable of SIDS.CSW provided review of Sudden Infant Death Syndrome (SIDS) precautions.  CSW reported the infant will sleep in  a bassinet. MOB reported she has essential items for the infant. MOB has chosen [facility] for infant's follow up. CSW assessed MOB for additional needs. MOB reported no further need.   CSW did not address infant loss in 2014 because there is no evidence to support the need to address trauma history at this time.   CSW identifies no further need for intervention and no barriers to discharge at this time.   Anali Cabanilla, MSW, LCSW [facility]  Clinical Social Worker  [phone] 06/17/2021  3:11 PM  

## 2021-06-18 LAB — SURGICAL PATHOLOGY

## 2021-06-18 NOTE — Discharge Summary (Signed)
Postpartum Discharge Summary  Date of Service updated 06/18/2021     Patient Name: [name] DOB: Jul 31, 1986 MRN: [id number]  Date of admission: 06/16/2021 Delivery date:06/16/2021  Delivering provider: [name]  Date of discharge: 06/18/2021  Admitting diagnosis: History of cesarean delivery [Z98.891] Intrauterine pregnancy: [redacted]w[redacted]d    Secondary diagnosis:  Active Problems:   History of cesarean delivery  Additional problems: chronic hypertension / morbid obesity     Discharge diagnosis: Term Pregnancy Delivered and CHTN                                              Post partum procedures: None Augmentation: N/A Complications: None  Hospital course: Sceduled C/S   35y.o. yo [id number] admitted to the hospital 06/16/2021 for scheduled cesarean section with the following indication:Elective Repeat.Delivery details are as follows:  Membrane Rupture Time/Date:  ,06/16/2021   Delivery Method:C-Section, Low Transverse  Details of operation can be found in separate operative note.  Patient had an uncomplicated postpartum course.  She is ambulating, tolerating a regular diet, passing flatus, and urinating well. Patient is discharged home in stable condition on  06/18/21        Newborn Data: Birth date:06/16/2021  Birth time:8:01 AM  Gender:Female  Living status:Living  Apgars:10 ,10  Weight:3520 g     Magnesium Sulfate received: No BMZ received: No Rhophylac:N/A MMR:N/A T-DaP:Given prenatally Flu: N/A Transfusion:No  Physical exam  Vitals:   06/17/21 1340 06/17/21 1505 06/17/21 2110 06/18/21 0500  BP: 130/80 124/86 130/90 115/87  Pulse: (!) 104 93 92 85  Resp: '17  16 17  ' Temp: 98.5 F (36.9 C)  98.2 F (36.8 C) 98.3 F (36.8 C)  TempSrc: Oral  Oral Oral  SpO2: 100%  99% 99%  Weight:      Height:       General: alert, cooperative, and no distress Lochia: appropriate Uterine Fundus: firm Incision: Dressing is clean, dry, and intact DVT  Evaluation: No evidence of DVT seen on physical exam. Labs: Lab Results  Component Value Date   WBC 7.4 06/17/2021   HGB 9.6 (L) 06/17/2021   HCT 30.5 (L) 06/17/2021   MCV 79.4 (L) 06/17/2021   PLT 269 06/17/2021   CMP Latest Ref Rng & Units 06/16/2021  Glucose 70 - 99 mg/dL 101(H)  BUN 6 - 20 mg/dL 8  Creatinine 0.44 - 1.00 mg/dL 0.56  Sodium 135 - 145 mmol/L 136  Potassium 3.5 - 5.1 mmol/L 4.0  Chloride 98 - 111 mmol/L 106  CO2 22 - 32 mmol/L 22  Calcium 8.9 - 10.3 mg/dL 8.9  Total Protein 6.5 - 8.1 g/dL 5.7(L)  Total Bilirubin 0.3 - 1.2 mg/dL 0.5  Alkaline Phos 38 - 126 U/L 69  AST 15 - 41 U/L 18  ALT 0 - 44 U/L 19   Edinburgh Score: Edinburgh Postnatal Depression Scale Screening Tool 06/16/2021  I have been able to laugh and see the funny side of things. 0  I have looked forward with enjoyment to things. 0  I have blamed myself unnecessarily when things went wrong. 0  I have been anxious or worried for no good reason. 2  I have felt scared or panicky for no good reason. 2  Things have been getting on top of me. 0  I have been so unhappy that I have  had difficulty sleeping. 0  I have felt sad or miserable. 0  I have been so unhappy that I have been crying. 0  The thought of harming myself has occurred to me. 0  Edinburgh Postnatal Depression Scale Total 4      After visit meds:  Allergies as of 06/18/2021       Reactions   Penicillins Other (See Comments)   Has patient had a PCN reaction causing immediate rash, facial/tongue/throat swelling, SOB or lightheadedness with hypotension: No Has patient had a PCN reaction causing severe rash involving mucus membranes or skin necrosis: No Has patient had a PCN reaction that required hospitalization No Has patient had a PCN reaction occurring within the last 10 years: No If all of the above answers are "NO", then may proceed with Cephalosporin use. *causes too much sleep        Medication List     STOP taking these  medications    aspirin EC 81 MG tablet       TAKE these medications    calcium carbonate 500 MG chewable tablet Commonly known as: TUMS - dosed in mg elemental calcium Chew 4 tablets by mouth 2 (two) times daily as needed for indigestion or heartburn.   docusate sodium 100 MG capsule Commonly known as: COLACE Take 300 mg by mouth daily.   ferrous sulfate 325 (65 FE) MG tablet Take 650 mg by mouth 2 (two) times daily with a meal.   fluticasone 50 MCG/ACT nasal spray Commonly known as: FLONASE Place 2 sprays into both nostrils daily as needed for allergies or rhinitis.   ibuprofen 600 MG tablet Commonly known as: ADVIL Take 1 tablet (600 mg total) by mouth every 6 (six) hours as needed.   loratadine 10 MG tablet Commonly known as: CLARITIN Take 10 mg by mouth daily.   metFORMIN 500 MG tablet Commonly known as: GLUCOPHAGE Take 500 mg by mouth 2 (two) times daily with a meal.   NIFEdipine 30 MG 24 hr tablet Commonly known as: ADALAT CC Take 1 tablet (30 mg total) by mouth daily.   oxyCODONE 5 MG immediate release tablet Commonly known as: Oxy IR/ROXICODONE Take 1-2 tablets (5-10 mg total) by mouth every 4 (four) hours as needed for moderate pain.   prenatal multivitamin Tabs tablet Take 1 tablet by mouth at bedtime.         Discharge home in stable condition Infant Feeding: Breast Infant Disposition:home with mother Discharge instruction: per After Visit Summary and Postpartum booklet. Activity: Advance as tolerated. Pelvic rest for 6 weeks.  Diet: carb modified diet Anticipated Birth Control: Unsure Postpartum Appointment:1 week Additional Postpartum F/U: Incision check 1 week Future Appointments:No future appointments. Follow up Visit:  Follow-up Information     [name], DO Follow up in 1 week(s).   Specialty: Obstetrics and Gynecology Why: We will arrange a 1 week incision check/Prevena removal and blood pressure check along with yoour 6 week  postpartum visit. Contact information: [place] [address] [phone]                     06/18/2021 Christophe Louis, MD

## 2021-06-18 NOTE — Progress Notes (Signed)
Converted wound vac to portable system, instructed patient on use and wound vac care, pt acknowledged instructions.  Will schedule f/u in one week to have wound vac removed.

## 2021-06-19 MED FILL — Heparin Sodium (Porcine) Inj 1000 Unit/ML: INTRAMUSCULAR | Qty: 30 | Status: AC

## 2021-06-19 MED FILL — Sodium Chloride IV Soln 0.9%: INTRAVENOUS | Qty: 1000 | Status: AC

## 2023-05-15 IMAGING — US US MFM FETAL BPP W/O NON-STRESS
1 series · 14 of 19 positions shown · non-contrast
Comparison: none

[Series 1: us mfm fetal bpp w/o non-stress · 19 acquisitions, 14 frames shown]
[im 1/19]
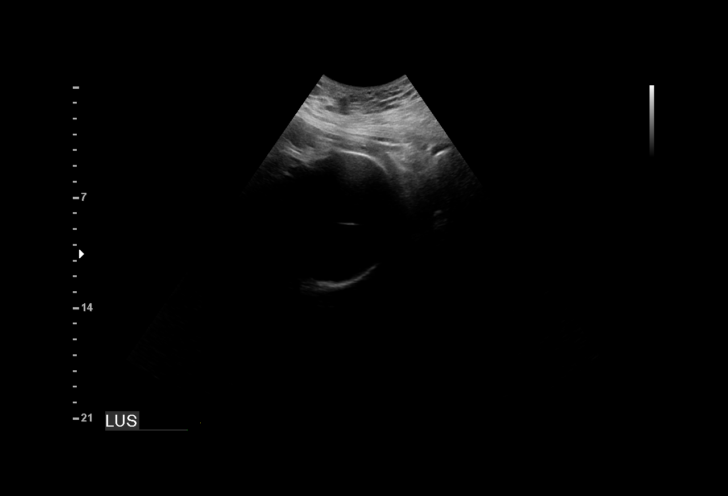
[im 3/19]
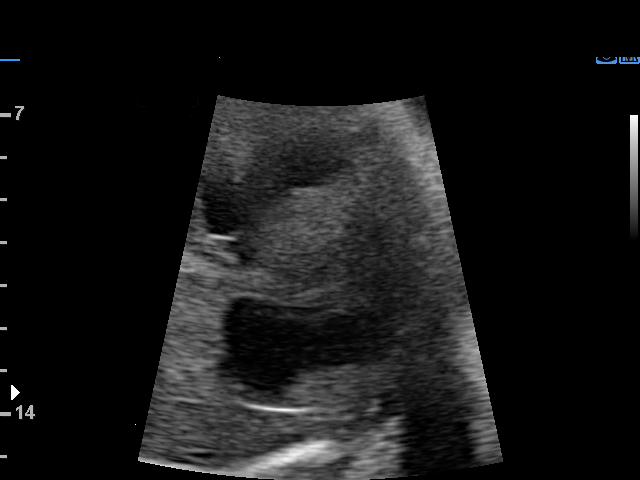
[im 4/19]
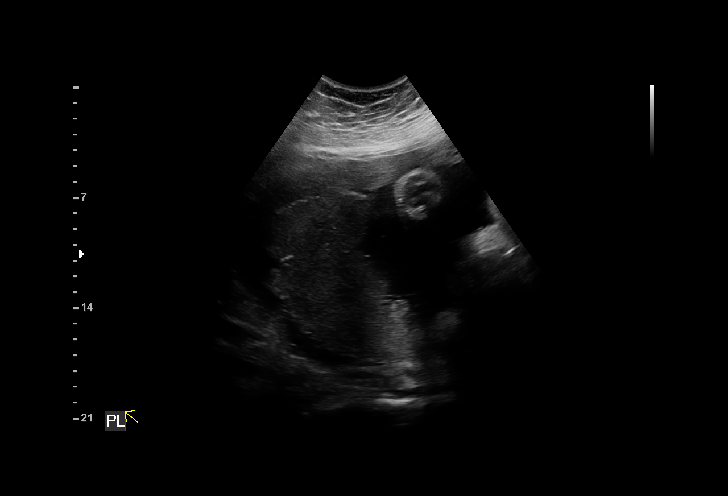
[im 5/19]
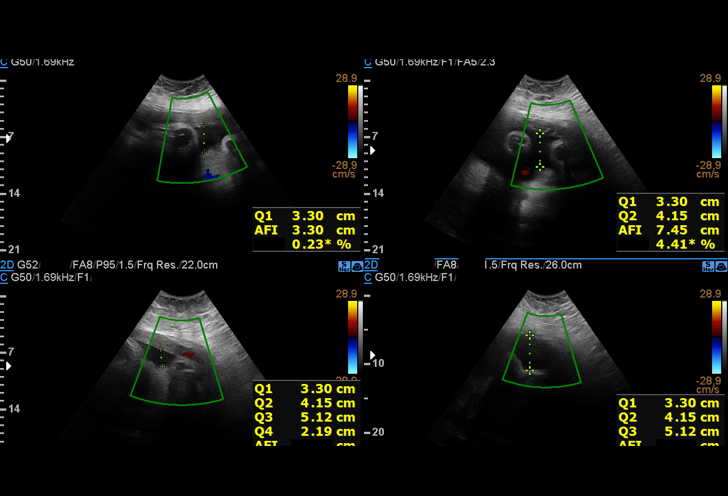
[im 7/19]
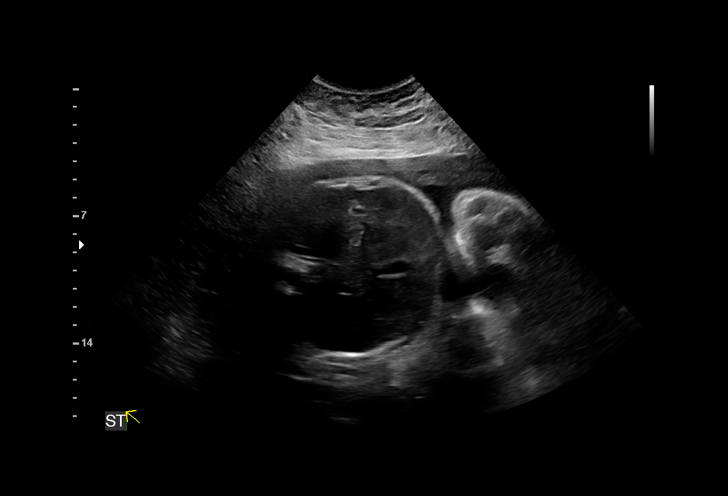
[im 8/19]
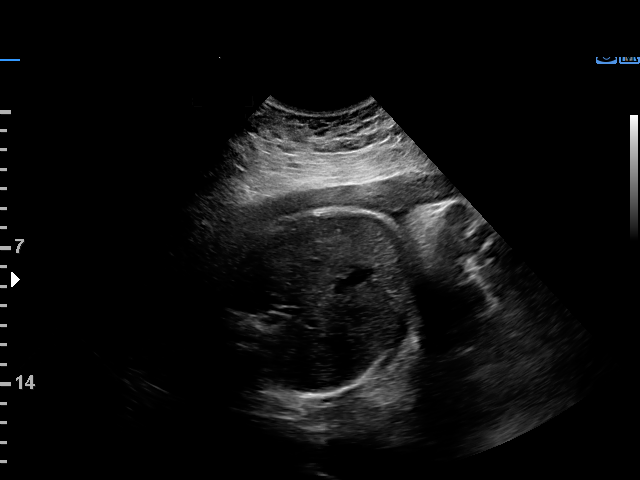
[im 9/19]
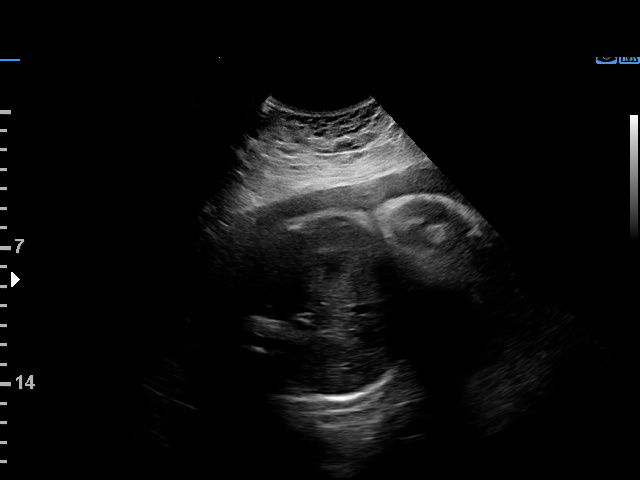
[im 11/19]
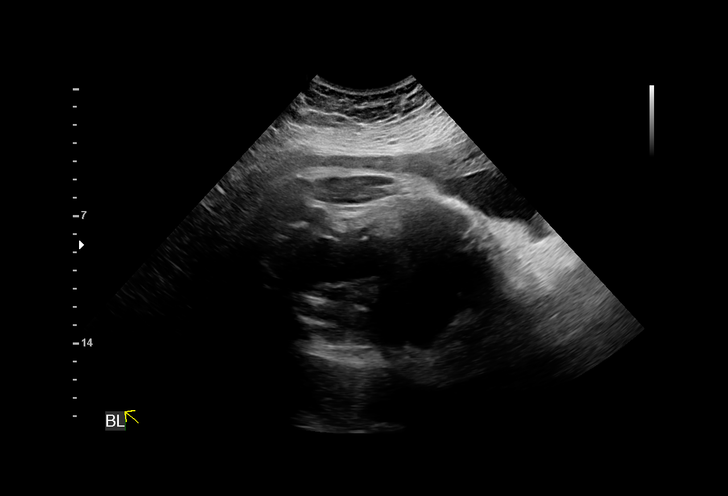
[im 12/19]
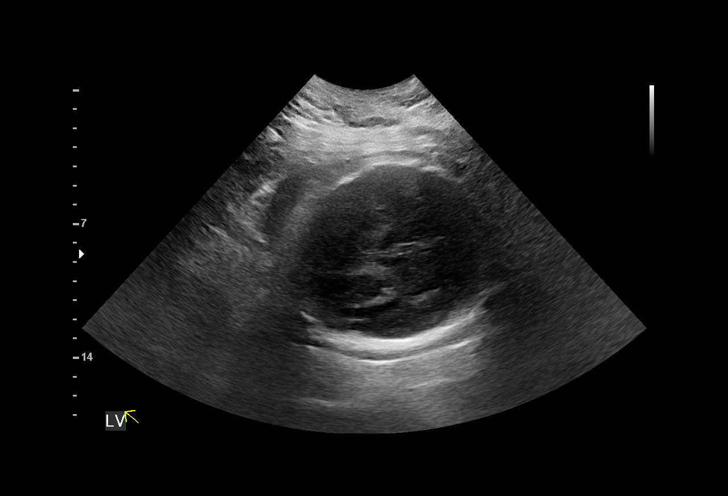
[im 13/19]
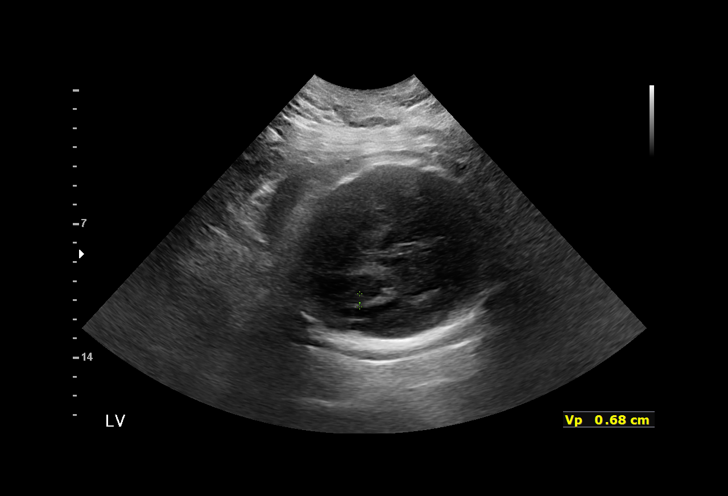
[im 15/19]
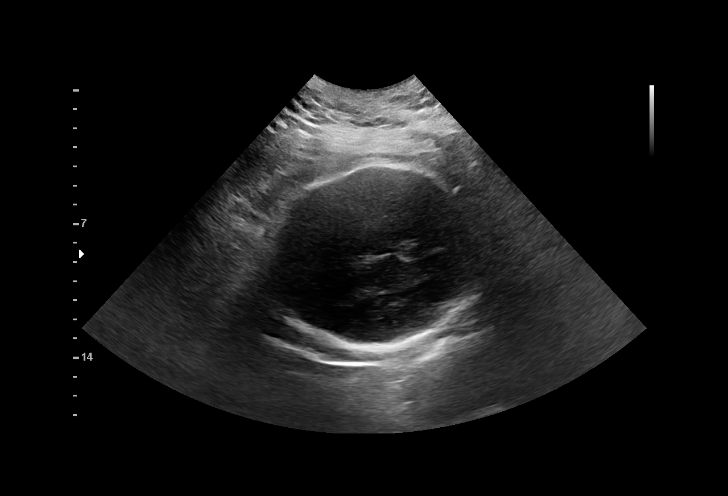
[im 16/19]
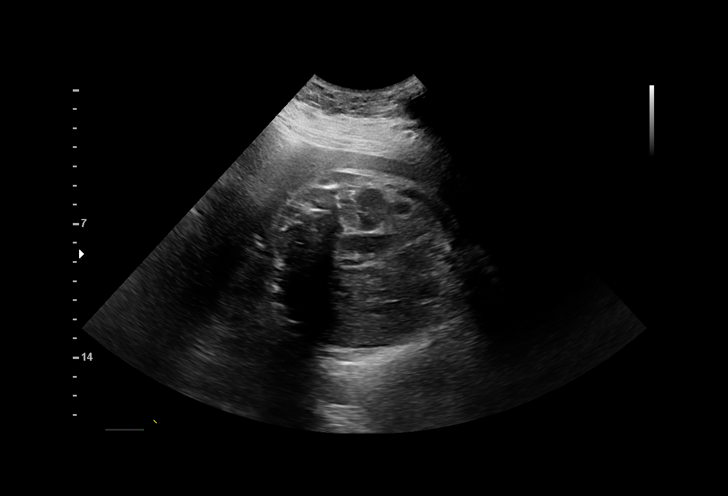
[im 17/19]
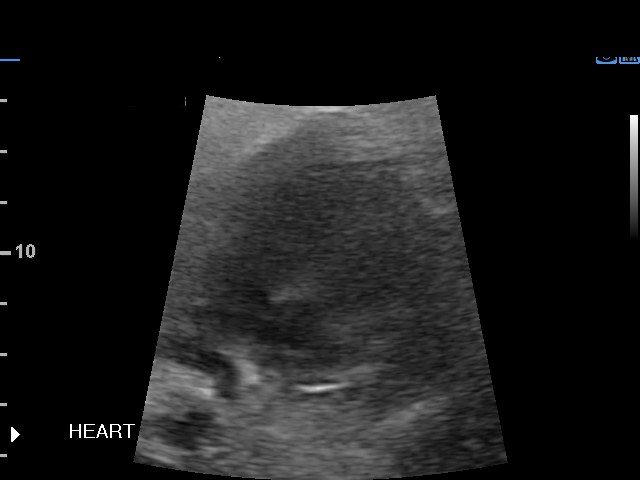
[im 19/19]
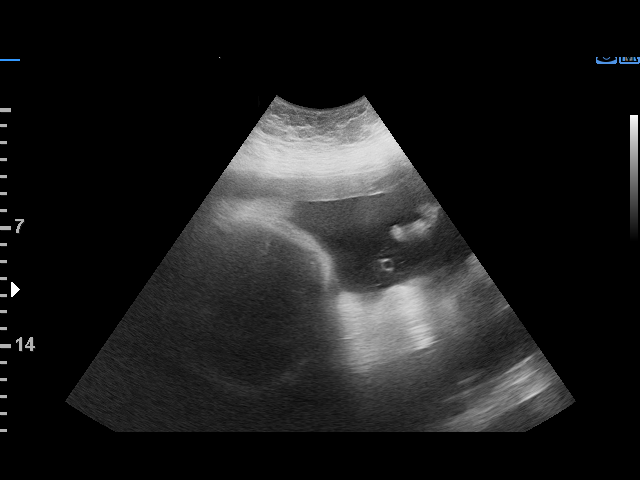

[14 of 19 positions shown; findings below may reference images not displayed]

Indications

 36 weeks gestation of pregnancy
 Diabetes - Pregestational,3rd trimester
 Obesity complicating pregnancy, third
 trimester (pregravid BMI 42)
 Poor obstetric history: Previous neonatal
 death (T21 and AV Canal, 65weeks demise)
 Advanced maternal age multigravida 35+,
 third trimester
 Poor obstetric history: Previous gestational
 diabetes(currently taking Metformin, ASA)
 Encounter for other antenatal screening
 follow-up
 History of cesarean delivery, currently
 pregnant (x2)
 Poor obstetric history: Previous
 preeclampsia / eclampsia/gestational HTN
Fetal Evaluation

 Num Of Fetuses:         1
 Preg. Location:         Intrauterine
 Fetal Heart Rate(bpm):  133
 Cardiac Activity:       Observed
 Presentation:           Cephalic
 Placenta:               Posterior
 P. Cord Insertion:      Previously Visualized

 Amniotic Fluid
 AFI FV:      Within normal limits

 AFI Sum(cm)     %Tile       Largest Pocket(cm)
 14.8            54
 RUQ(cm)       RLQ(cm)       LUQ(cm)        LLQ(cm)

Biophysical Evaluation

 Amniotic F.V:   Within normal limits       F. Tone:        Observed
 F. Movement:    Observed                   Score:          [DATE]
 F. Breathing:   Observed
OB History

 Gravidity:    4
 Living:       1
Gestational Age

 LMP:           36w 1d        Date:  09/21/20                 EDD:   06/28/21
 Best:          36w 1d     Det. By:  LMP  (09/21/20)          EDD:   06/28/21
Anatomy

 Cranium:               Appears normal         LVOT:                   Previously seen
 Cavum:                 Appears normal         Aortic Arch:            Previously seen
 Ventricles:            Appears normal         Ductal Arch:            Previously seen
 Choroid Plexus:        Previously seen        Diaphragm:              Previously seen
 Cerebellum:            Previously seen        Stomach:                Appears normal, left
                                                                       sided
 Posterior Fossa:       Previously seen        Abdomen:                Previously seen
 Nuchal Fold:           Not applicable (>20    Abdominal Wall:         Previously seen
                        wks GA)
 Face:                  Orbits nl; profile not Cord Vessels:           Previously seen
                        well visualized
 Lips:                  Not well visualized    Kidneys:                Appear normal
 Palate:                Previously seen        Bladder:                Appears normal
 Thoracic:              Previously seen        Spine:                  Previously seen
 Heart:                 Not well visualized    Upper Extremities:      Previously seen
 RVOT:                  Not well visualized    Lower Extremities:      Previously seen

 Other:  Technically difficult due to maternal habitus and fetal position. Female
         gender previously seen.Fetal Echo normal.
Cervix Uterus Adnexa

 Cervix
 Not visualized (advanced GA >77wks)

 Right Ovary
 Not visualized.

 Left Ovary
 Not visualized.
Impression

 Amniotic fluid is normal and good fetal activity is seen
 .Antenatal testing is reassuring. BPP [DATE].  Cephalic
 presentation.
 Blood pressure today at her office is 133/90 mmHg.
Recommendations

 BPP next week.
                 Tiger, Our[place]

## 2023-09-30 ENCOUNTER — Other Ambulatory Visit: Payer: Self-pay | Admitting: Internal Medicine

## 2023-09-30 ENCOUNTER — Ambulatory Visit
Admission: RE | Admit: 2023-09-30 | Discharge: 2023-09-30 | Disposition: A | Discharge status: Home / Self Care | Payer: BC Managed Care – PPO | Source: Ambulatory Visit | Attending: Internal Medicine | Admitting: Internal Medicine

## 2023-09-30 DIAGNOSIS — J22 Unspecified acute lower respiratory infection: Secondary | ICD-10-CM
